# Patient Record
Sex: Female | Born: 1935
Health system: Southern US, Community
[De-identification: ages and names within clinical notes are randomized; demographics above are authoritative.]

## PROBLEM LIST (undated history)

## (undated) DIAGNOSIS — I272 Pulmonary hypertension, unspecified: Secondary | ICD-10-CM

## (undated) DIAGNOSIS — H3530 Unspecified macular degeneration: Secondary | ICD-10-CM

## (undated) DIAGNOSIS — I10 Essential (primary) hypertension: Secondary | ICD-10-CM

## (undated) DIAGNOSIS — I2510 Atherosclerotic heart disease of native coronary artery without angina pectoris: Secondary | ICD-10-CM

## (undated) DIAGNOSIS — F319 Bipolar disorder, unspecified: Secondary | ICD-10-CM

## (undated) DIAGNOSIS — K219 Gastro-esophageal reflux disease without esophagitis: Secondary | ICD-10-CM

## (undated) DIAGNOSIS — J449 Chronic obstructive pulmonary disease, unspecified: Secondary | ICD-10-CM

## (undated) HISTORY — PX: DILATION AND CURETTAGE OF UTERUS: SHX78

## (undated) HISTORY — PX: OTHER SURGICAL HISTORY: SHX169

## (undated) HISTORY — DX: Pulmonary hypertension, unspecified: I27.20

## (undated) HISTORY — DX: Bipolar disorder, unspecified: F31.9

## (undated) HISTORY — DX: Atherosclerotic heart disease of native coronary artery without angina pectoris: I25.10

## (undated) HISTORY — DX: Essential (primary) hypertension: I10

## (undated) HISTORY — PX: CATARACT EXTRACTION: SUR2

## (undated) HISTORY — DX: Chronic obstructive pulmonary disease, unspecified: J44.9

## (undated) HISTORY — DX: Gastro-esophageal reflux disease without esophagitis: K21.9

## (undated) HISTORY — DX: Unspecified macular degeneration: H35.30

---

## 2004-09-02 ENCOUNTER — Ambulatory Visit: Payer: Self-pay | Admitting: Internal Medicine

## 2004-10-20 ENCOUNTER — Ambulatory Visit: Payer: Self-pay | Admitting: Internal Medicine

## 2004-10-20 ENCOUNTER — Ambulatory Visit (HOSPITAL_COMMUNITY): Admission: RE | Admit: 2004-10-20 | Discharge: 2004-10-20 | Payer: Self-pay | Admitting: Internal Medicine

## 2005-10-11 ENCOUNTER — Ambulatory Visit: Payer: Self-pay | Admitting: Cardiology

## 2005-12-02 ENCOUNTER — Encounter: Payer: Self-pay | Admitting: Cardiology

## 2006-10-12 ENCOUNTER — Ambulatory Visit: Payer: Self-pay | Admitting: Cardiology

## 2007-01-12 ENCOUNTER — Ambulatory Visit: Payer: Self-pay | Admitting: Cardiology

## 2007-12-03 ENCOUNTER — Ambulatory Visit: Payer: Self-pay | Admitting: Cardiology

## 2007-12-05 ENCOUNTER — Encounter: Payer: Self-pay | Admitting: Cardiology

## 2008-10-09 ENCOUNTER — Encounter: Payer: Self-pay | Admitting: Cardiology

## 2008-10-22 ENCOUNTER — Encounter: Payer: Self-pay | Admitting: Cardiology

## 2009-01-14 ENCOUNTER — Encounter: Payer: Self-pay | Admitting: Cardiology

## 2009-02-10 ENCOUNTER — Ambulatory Visit (HOSPITAL_COMMUNITY): Admission: RE | Admit: 2009-02-10 | Discharge: 2009-02-10 | Payer: Self-pay | Admitting: Ophthalmology

## 2009-04-15 DIAGNOSIS — I2789 Other specified pulmonary heart diseases: Secondary | ICD-10-CM

## 2009-04-15 DIAGNOSIS — I10 Essential (primary) hypertension: Secondary | ICD-10-CM

## 2009-05-06 ENCOUNTER — Ambulatory Visit: Payer: Self-pay | Admitting: Cardiology

## 2009-05-06 DIAGNOSIS — I6529 Occlusion and stenosis of unspecified carotid artery: Secondary | ICD-10-CM

## 2009-05-06 DIAGNOSIS — R011 Cardiac murmur, unspecified: Secondary | ICD-10-CM

## 2009-05-06 DIAGNOSIS — J449 Chronic obstructive pulmonary disease, unspecified: Secondary | ICD-10-CM

## 2009-06-01 ENCOUNTER — Ambulatory Visit: Payer: Self-pay | Admitting: Cardiology

## 2009-06-01 ENCOUNTER — Encounter: Payer: Self-pay | Admitting: Cardiology

## 2009-10-05 ENCOUNTER — Encounter: Payer: Self-pay | Admitting: Cardiology

## 2009-12-08 ENCOUNTER — Encounter: Payer: Self-pay | Admitting: Cardiology

## 2009-12-09 ENCOUNTER — Ambulatory Visit: Payer: Self-pay | Admitting: Cardiology

## 2009-12-09 DIAGNOSIS — E785 Hyperlipidemia, unspecified: Secondary | ICD-10-CM | POA: Insufficient documentation

## 2010-08-17 NOTE — Miscellaneous (Signed)
  Clinical Lists Changes  Observations: Added new observation of PAST MED HX: PULMONARY HYPERTENSION (ICD-416.8) HYPERTENSION, UNSPECIFIED (ICD-401.9) Bipolar disorder with multiple medications. Carotid artery disease... Doppler.. may, 2009... minimal bilateral disease EF  greater than 65%...echo... November 15 02010.... hyperdynamic function... mild RV dilatation... mild MR... mild TR... 40-45 mm mercury COPD.Marland Kitchen CT chest... may, 2007.Marland KitchenMarland KitchenDr. Orson Aloe Granuloma.. right upper lobe... CT/... may, 2007 (12/08/2009 14:34) Added new observation of PRIMARY MD: Doreen Beam, MD (12/08/2009 14:34)       Past History:  Past Medical History: PULMONARY HYPERTENSION (ICD-416.8) HYPERTENSION, UNSPECIFIED (ICD-401.9) Bipolar disorder with multiple medications. Carotid artery disease... Doppler.. may, 2009... minimal bilateral disease EF  greater than 65%...echo... November 15 02010.... hyperdynamic function... mild RV dilatation... mild MR... mild TR... 40-45 mm mercury COPD.Marland Kitchen CT chest... may, 2007.Marland KitchenMarland KitchenDr. Orson Aloe Granuloma.. right upper lobe... CT/... may, 2007

## 2010-08-17 NOTE — Assessment & Plan Note (Signed)
Summary: PER PT CALL-JM   Visit Type:  Follow-up Primary Provider:  Doreen Beam, MD  CC:  hypertension.  History of Present Illness: The patient is seen for cardiology followup.  She is a delightful lady who is now 77.  She has brought me a long list of questions concerning multiple medical tests he think she should have.  I have stressed to her that she is healthy at age 77 and that she does not need these tests.  She had a screening study done by a company that gives you a cardiac risk based on your Omega 3 levels.  I explained to her that this is not formally recommended but that taking omega-3 fish oil would certainly be appropriate.  She's not having any chest pain or shortness of breath.  Preventive Screening-Counseling & Management  Alcohol-Tobacco     Smoking Status: never  Current Medications (verified): 1)  Pa Resveratrol 250 Mg Caps (Resveratrol) .... Take 1 Tablet By Mouth Once A Day 2)  Coq10 30 Mg Caps (Coenzyme Q10) .... Take 1 Tablet By Mouth Once A Day 3)  Multivitamins  Caps (Multiple Vitamin) .... Take 1 Tablet By Mouth Once A Day 4)  Vitamin D3 2000 Unit Caps (Cholecalciferol) .... Take 1 Tablet By Mouth Once A Day 5)  Divalproex Sodium 250 Mg Tbec (Divalproex Sodium) .... Take 1 Tablet By Mouth Two Times A Day' 6)  Trazodone Hcl 50 Mg Tabs (Trazodone Hcl) .... Take 1 Tab By Mouth At Bedtime 7)  Lisinopril 40 Mg Tabs (Lisinopril) .... Take 1 Tablet By Mouth Once A Day 8)  Risperdal 1 Mg Tabs (Risperidone) .... Take 1 Tab By Mouth At Bedtime 9)  Turmeric Curcumin  Caps (Misc Natural Products) .... Take 1 Tablet By Mouth Once A Day 10)  Advanced Care Cholesterol  Kit (Cholesterol) .... Take 1 Tablet By Mouth Once A Day 11)  Red Yeast Rice  Powd (Red Yeast Rice Extract) .... 500mg  Take 1 Tablet By Mouth Once A Day 12)  Strontium Chloride  Crys (Strontium Chloride) .... Take 1 Tablet By Mouth Once A Day 13)  Opti-Gold .... Take 1 Tablet By Mouth Once A Day 14)  Metoprolol  Succinate 50 Mg Xr24h-Tab (Metoprolol Succinate) .... Take 1 Tablet By Mouth Once A Day  Allergies (verified): 1)  ! Pcn 2)  ! Sulfa 3)  ! Doxycycline 4)  ! * Seroquel  Comments:  Nurse/Medical Assistant: The patient's medications and allergies were reviewed with the patient and were updated in the Medication and Allergy Lists. Bottles and verbally reviewed.  Past History:  Past Medical History: Last updated: 12/08/2009 PULMONARY HYPERTENSION (ICD-416.8) HYPERTENSION, UNSPECIFIED (ICD-401.9) Bipolar disorder with multiple medications. Carotid artery disease... Doppler.. may, 2009... minimal bilateral disease EF  greater than 65%...echo... November 15 02010.... hyperdynamic function... mild RV dilatation... mild MR... mild TR... 40-45 mm mercury COPD.Marland Kitchen CT chest... may, 2007.Marland KitchenMarland KitchenDr. Orson Aloe Granuloma.. right upper lobe... CT/... may, 2007  Review of Systems       Patient denies fever, chills, headache, sweats, rash, change in vision, change in hearing, chest pain, cough, nausea vomiting, urinary symptoms.  All other systems are reviewed and are negative.  Vital Signs:  Patient profile:   77 year old female Height:      65 inches Weight:      161 pounds BMI:     26.89 Pulse rate:   87 / minute BP sitting:   135 / 80  (left arm) Cuff size:   regular  Vitals  Entered By: Carlye Grippe (Dec 09, 2009 1:14 PM)  Physical Exam  General:  patient is stable in general. Eyes:  no xanthelasma. Neck:  no jugular venous distention. Lungs:  lungs are clear.  Respiratory effort is nonlabored. Heart:  cardiac exam reveals S1-S2.  No clicks or significant murmurs. Abdomen:  abdomen is soft. Extremities:  no peripheral edema. Psych:  patient is oriented to person time and place.  As outlined she has a multitude of questions.   Impression & Recommendations:  Problem # 1:  MURMUR (ICD-785.2) One I had seen the patient last we arranged for a 2-D echo.  Study was done November, 2010.   She has good left ventricular function.  There is mild mitral regurgitation.  I explained this to her..  She does not need any further followup.  Problem # 2:  CAROTID ARTERY DISEASE (ICD-433.10) We know her carotid disease is only mild.  She does not need testing at this time.  Problem # 3:  HYPERTENSION, UNSPECIFIED (ICD-401.9)  Her updated medication list for this problem includes:    Lisinopril 40 Mg Tabs (Lisinopril) .Marland Kitchen... Take 1 tablet by mouth once a day    Metoprolol Succinate 50 Mg Xr24h-tab (Metoprolol succinate) .Marland Kitchen... Take 1 tablet by mouth once a day Blood pressure is controlled.  No change in therapy.  Problem # 4:  DYSLIPIDEMIA (ICD-272.4) Recent labs showed her LDL is 150.  She has no proven coronary disease and she is 77 years of age.  Red yeast rice has been recommended to her and I certainly agree.  I am also in favor of omega 3 fish oil.  I feel she should not have any other tests done at this point.  Patient Instructions: 1)  Your physician wants you to follow-up in: 6 months. You will receive a reminder letter in the mail one-two months in advance. If you don't receive a letter, please call our office to schedule the follow-up appointment. 2)  Your physician recommends that you continue on your current medications as directed. Please refer to the Current Medication list given to you today.

## 2010-10-24 LAB — HEMOGLOBIN AND HEMATOCRIT, BLOOD
HCT: 34.7 % — ABNORMAL LOW (ref 36.0–46.0)
Hemoglobin: 12 g/dL (ref 12.0–15.0)

## 2010-10-24 LAB — BASIC METABOLIC PANEL
BUN: 15 mg/dL (ref 6–23)
CO2: 29 mEq/L (ref 19–32)
Chloride: 106 mEq/L (ref 96–112)
Creatinine, Ser: 0.85 mg/dL (ref 0.4–1.2)
GFR calc non Af Amer: >60 mL/min (ref >60)
Glucose, Bld: 90 mg/dL (ref 70–99)

## 2010-11-30 NOTE — Assessment & Plan Note (Signed)
[facility]                          [facility]   NAME:Campbell Campbell [place]G                     MRN:          [id number]  DATE:01/12/2007                            DOB:          May 18, 1918    Ms. Isabella Campbell is seen for followup.  She is a delightful elderly lady.  I saw  her last in March of 2008.  I have a note from Dr. Orson Aloe from 2007.  I have encouraged the patient to see Dr. Orson Aloe in followup and we  will help arrange this.  We know that she has good LV function.  We know  she has moderate pulmonary hypertension.  She does have shortness of  breath.  Dr. Orson Aloe also noted that she had mild obstructive lung  disease by PFTs.  He had started her on oxygen and there was question of  a trial of Foradil and it is time for followup.   She is doing well.  She does have some shortness of breath with  exertion.  She has not had chest pain or syncope.   PAST MEDICAL HISTORY:  ALLERGIES:  PENICILLIN, SULFA, DOXYCYCLINE.   MEDICATIONS:  Multiple home medications.  1. Benicar 40.  2. N-acetylcysteine.  3. Trazodone  4. Metoprolol.  5. Risperdal.  6. Alprazolam.  7. Depakote.   OTHER MEDICAL PROBLEMS:  See the list below.   REVIEW OF SYSTEMS:  Other than some exertional shortness of breath, she  is feeling well.   PHYSICAL EXAMINATION:  Blood pressure is 125/71 with a pulse of 64.  Weight is 164.  The patient is oriented to person, time, and place.  Her  affect is normal today.  She had multiple questions for me and we  answered all of them.  HEENT:  Reveals no xanthelasma, she has normal extraocular motion.  There are no carotid bruits.  There is no jugular venous distention.  CARDIAC:  Reveals an S1 with an S2.  There are no clicks or significant  murmurs.  LUNGS:  Clear.  Respiratory effort is not labored.  She has normal bowel sounds.  There is no significant peripheral edema.   PROBLEM:  1,  ALLERGIES TO PENICILLIN, SULFA,  AND DOXYCYCLINE.  1. Bipolar disorder with multiple medications.  2. Hypertension, treated.  3. Question of mild carotid disease by history.  4. [facility] screening showing ankle-brachial index of 0.8 in her right      leg.  5. Normal systolic function by echo in 2007.  6. Moderate pulmonary hypertension by echo in 2007.  7. Shortness of breath.  At this point it would appear that this is      primarily pulmonary.  We will be arranging followup with Dr.      Orson Aloe.     Luis Abed, MD, [facility], [place]  Electronically Signed    JDK/MedQ  DD: 01/12/2007  DT: 01/12/2007  Job #: [id number]   cc:   Cherie Ouch  Dhruv Vyas

## 2010-11-30 NOTE — Assessment & Plan Note (Signed)
[facility]                          [facility]   NAME:Isabella Campbell, Isabella Campbell                     MRN:          [id number]  DATE:12/03/2007                            DOB:          [birth year]    Isabella Campbell is back for follow-up.  She is actually doing very well.  She  is 77 years old.  She has brought me the multiple articles that she has  read that question whether she should be having other studies none of  which would be approved by her insurance company.  She wants to have a  hypercoagulable analysis to see if she has a hypercoagulable problem.  I  explained to her that she is 77 years of age and she has never had a  blood clot, and that this would not be appropriate at this time.  She  also wants some another study called a PLAC study, a plaque study.  At  this time will not do this.  We will proceed with a fasting lipid  profile.  The patient has good LV function.  She has some pulmonary  hypertension.  She has mild obstructive pulmonary disease.  She is on  oxygen at home.   ALLERGIES:  PENICILLIN, SULFA, DOXYCYCLINE.   MEDICATIONS:  1. Magnesium.  2. Alprazolam.  3. Depakote.  4. Calcium.  5. Multivitamin.  6. Coenzyme Q10.   OTHER MEDICAL PROBLEMS:  See the list below.   REVIEW OF SYSTEMS:  She does have some shortness of breath with  exertion.   Otherwise, Review of Systems is negative.   PHYSICAL EXAMINATION:  VITAL SIGNS:  Blood pressure is 119/63 with a  pulse of 70.  GENERAL:  The patient is oriented to person, time, and place.  Her  affect reveals that she is extremely inquisitive as outlined above.  Wants all types of testing.  LUNGS:  Clear.  Respiratory effort is not labored.  HEENT:  Reveals no xanthelasma.  She has normal extraocular motion.  NECK:  There are no carotid bruits.  There is no jugular venous  distention.  CARDIAC:  Reveals S1 with an S2.  There are no clicks or significant  murmurs.  ABDOMEN:   Soft.  EXTREMITIES:  She has no peripheral edema.   PROBLEMS:  1. Bipolar disorder with multiple medications.  2. Hypertension treated.  3. Question of mild carotid disease by history.  We need to arrange      for a follow-up carotid Doppler.  4. Normal systolic function by echo in 2007.  5. Moderate pulmonary hypertension.  I believe we should not be      pushing for other studies at this time.   Overall the patient is stable.  We will obtain fasting lipid and obtain  carotid Dopplers.     Luis Abed, MD, [facility]  Electronically Signed    JDK/MedQ  DD: 12/03/2007  DT: 12/03/2007  Job #: [id number]   cc:   Doreen Beam, MD  Cherie Ouch

## 2010-12-03 NOTE — Op Note (Signed)
NAME:  Kivett, Shulamit              ACCOUNT NO.:  [id number]   MEDICAL RECORD NO.:  [id number]          PATIENT TYPE:  AMB   LOCATION:  DAY                           FACILITY:  [facility]   PHYSICIAN:  Lionel December, M.D.    DATE OF BIRTH:  April 15, 1918   DATE OF PROCEDURE:  10/20/2004  DATE OF DISCHARGE:                                 OPERATIVE REPORT   PROCEDURE:  Esophagogastroduodenoscopy.   Isabella Campbell is an 77 year old Caucasian female who is had symptoms of GERD for  a year, who has not responded well to therapy.  She has failed Aciphex.  She  was seen in the office six weeks ago and given samples of two different  PPIs.  She is not sure if she had better results from Prevacid.  She is  concerned that she may have damaged her esophagus.  She took Fosamax for  three years.  Given late onset of her symptoms, the fact that she is not  responding to therapy, EGD was advised.  The patient is agreeable.  The  procedure risks were reviewed the patient,  informed consent was obtained.   PREMEDICATION:  Cetacaine spray for pharyngeal topical anesthesia, Demerol  425 mg IV, Versed 2 mg IV in divided dose.   FINDINGS:  Procedure performed in endoscopy suite.  The patient's vital  signs and O2 saturation were monitored during procedure and remained stable.  The patient was placed in the left lateral recumbent position and Olympus  video scope was passed via oropharynx without any difficulty into esophagus.  Mucosa of the esophagus was normal.  GE junction was located at 38 cm from  the incisors, and there was erythema right at the Z-line.  No hernia was  noted.   Stomach:  It was empty and distended very well with insufflation.  Folds of  the proximal stomach were normal.  Three prepyloric erosions were noted.  Pyloric channel was patent.  There was a small fundal polyp, which was  ablated via cold biopsy.  No ulcers or tumor mass.   Duodenum:  Bulbar mucosa was normal.  The scope was passed to  the second  part of the duodenum, where mucosa and folds were normal.  Endoscope was  withdrawn.  The patient tolerated the procedure well.   FINAL DIAGNOSES:  1.  Mild changes of reflux esophagitis limited the gastroesophageal      junction.  No evidence of Barrett's or hernia.  2.  Erosive antral gastritis.  3.  Fundal polyp.  It was ablated via cold biopsy.   RECOMMENDATIONS:  1.  She will continue entire reflux measures.  2.  She will stay on Prevacid 30 mg p.o. q.a.m. for at least four weeks      before any changes are made in the therapy.  3.  I will be contacting the patient before further changes of made.  4.  Next H. pylori serology will be checked today.  5.  I will be contacting the patient with results of biopsy and blood test.      NR/MEDQ  D:  10/20/2004  T:  10/20/2004  Job:  [id number]   cc:   Doreen Beam  [address]  [place]  [place] [address]  Fax: [phone]

## 2010-12-03 NOTE — Assessment & Plan Note (Signed)
[facility]                          [facility]   NAME:Isabella Campbell, Isabella Campbell                     MRN:          [id number]  DATE:10/12/2006                            DOB:          June 01, 1918    Ms. Isabella Campbell is seen for cardiology followup.  This delightful 77 year old  lady is stable.  She has had some sensation of shortness of breath in  the past.  I had seen her with a complete evaluation on October 11, 2005.  At that time, I felt that her cardiac status was stable.  She has normal  LV function.  I felt that she could be watched medically.  I recommended  that pulmonary evaluation could be considered.  The patient tells me  that she saw Dr. Orson Campbell.  I will obtain records from him.  There was  question of a partial heart catheterization (per the patient).  I  presumed that this may have been a right heart cath and there was  consideration of this being done by Dr. Earna Campbell in [place].  The  patient tells me that she had a follow up echo there.  She decided not  to have the heart cath.  She is actually quite stable.  She has multiple  questions for me about various other medications.  She is stable.  She  is not having chest pain.  There has been no syncope or presyncope.  She  is going about reasonable activities.   PAST MEDICAL HISTORY:   ALLERGIES:  PENICILLIN, SULFA, and DOXYCYCLINE.   MEDICATIONS:  Cod liver oil, tumeric, cider vinegar, green tea, Campbell  vera, blueberry leaf.  I made it clear to the patient that it is my  practice to not recommend herbal medicines.  However, final decision  will be up to her.   OTHER MEDICAL PROBLEMS:  See the list below.   REVIEW OF SYSTEMS:  She has no significant complaints.  She does have  some shortness of breath.  However, this is quite stable.  Otherwise her  review of systems is negative.   PHYSICAL EXAMINATION:  Her blood pressure today is 140/76 with a pulse  of 62.  Her weight is 167  pounds.  The patient is oriented to person, time and place.  Affect is normal.  She is very alert and very inquisitive.  HEENT:  Reveals no xanthelasma.  She has normal extraocular motion.  There are no carotid bruits.  There is no jugular venous distention.  CARDIAC:  Exam reveals an S1 with an S2.  There are no clicks or  significant murmurs.  LUNGS:  Are clear.  There is no respiratory distress.  ABDOMEN:  Is soft.  There are no masses or bruits.  She has no significant peripheral edema.  There are no major  musculoskeletal deformities.   The CT scan that was ordered by Dr. Orson Campbell in May 2007 revealed that  she had emphysematous changes.  She had a small granuloma in the right  upper lobe.  She had a heterogenous left thyroid lobe and a simple left  mid pole renal cyst.  EKG today shows no significant abnormality.   PROBLEMS:  1. Allergies to PENICILLIN, SULFA and DOXYCYCLINE.  2. Bipolar disorder on multiple medications.  3. Hypertension - treated.  4. Life line screening with a question of mild disease of her left      carotid.  5. Life line screening showing an ankle brachial index of 0.8 on her      right leg.  6. Echocardiogram showing normal systolic function.  7. Echocardiogram revealing moderate pulmonary hypertension.  8. Difficulty with breathing and panting and this is stable.   I believe that Ms. Isabella Campbell is stable.  I will ask for information from Dr.  Orson Campbell concerning her pulmonary workup.  I would recommend no further  cardiac workup at this time.     Isabella Abed, MD, [facility]  Electronically Signed    JDK/MedQ  DD: 10/12/2006  DT: 10/12/2006  Job #: [id number]   cc:   Dr. Orson Campbell

## 2012-02-10 ENCOUNTER — Encounter: Payer: Self-pay | Admitting: Pediatrics

## 2012-02-13 ENCOUNTER — Encounter: Payer: Self-pay | Admitting: Pediatrics

## 2015-08-06 DIAGNOSIS — E2839 Other primary ovarian failure: Secondary | ICD-10-CM | POA: Diagnosis not present

## 2015-09-02 DIAGNOSIS — I739 Peripheral vascular disease, unspecified: Secondary | ICD-10-CM | POA: Diagnosis not present

## 2015-09-02 DIAGNOSIS — B351 Tinea unguium: Secondary | ICD-10-CM | POA: Diagnosis not present

## 2015-09-02 DIAGNOSIS — L110 Acquired keratosis follicularis: Secondary | ICD-10-CM | POA: Diagnosis not present

## 2015-09-14 DIAGNOSIS — F3181 Bipolar II disorder: Secondary | ICD-10-CM | POA: Diagnosis not present

## 2015-09-22 DIAGNOSIS — F319 Bipolar disorder, unspecified: Secondary | ICD-10-CM | POA: Diagnosis not present

## 2015-09-22 DIAGNOSIS — R6889 Other general symptoms and signs: Secondary | ICD-10-CM | POA: Diagnosis not present

## 2015-09-22 DIAGNOSIS — J111 Influenza due to unidentified influenza virus with other respiratory manifestations: Secondary | ICD-10-CM | POA: Diagnosis not present

## 2015-09-22 DIAGNOSIS — I10 Essential (primary) hypertension: Secondary | ICD-10-CM | POA: Diagnosis not present

## 2015-11-11 DIAGNOSIS — B351 Tinea unguium: Secondary | ICD-10-CM | POA: Diagnosis not present

## 2015-11-11 DIAGNOSIS — I739 Peripheral vascular disease, unspecified: Secondary | ICD-10-CM | POA: Diagnosis not present

## 2015-11-11 DIAGNOSIS — L110 Acquired keratosis follicularis: Secondary | ICD-10-CM | POA: Diagnosis not present

## 2015-11-27 DIAGNOSIS — Z880 Allergy status to penicillin: Secondary | ICD-10-CM | POA: Diagnosis not present

## 2015-11-27 DIAGNOSIS — Z79899 Other long term (current) drug therapy: Secondary | ICD-10-CM | POA: Diagnosis not present

## 2015-11-27 DIAGNOSIS — I272 Other secondary pulmonary hypertension: Secondary | ICD-10-CM | POA: Diagnosis not present

## 2015-11-27 DIAGNOSIS — Z882 Allergy status to sulfonamides status: Secondary | ICD-10-CM | POA: Diagnosis not present

## 2015-11-27 DIAGNOSIS — Z888 Allergy status to other drugs, medicaments and biological substances status: Secondary | ICD-10-CM | POA: Diagnosis not present

## 2015-11-27 DIAGNOSIS — Z881 Allergy status to other antibiotic agents status: Secondary | ICD-10-CM | POA: Diagnosis not present

## 2015-11-27 DIAGNOSIS — F419 Anxiety disorder, unspecified: Secondary | ICD-10-CM | POA: Diagnosis not present

## 2015-11-27 DIAGNOSIS — R079 Chest pain, unspecified: Secondary | ICD-10-CM | POA: Diagnosis not present

## 2015-11-28 DIAGNOSIS — I272 Other secondary pulmonary hypertension: Secondary | ICD-10-CM | POA: Diagnosis not present

## 2015-11-28 DIAGNOSIS — R079 Chest pain, unspecified: Secondary | ICD-10-CM | POA: Diagnosis not present

## 2015-12-02 DIAGNOSIS — L89893 Pressure ulcer of other site, stage 3: Secondary | ICD-10-CM | POA: Diagnosis not present

## 2015-12-02 DIAGNOSIS — I739 Peripheral vascular disease, unspecified: Secondary | ICD-10-CM | POA: Diagnosis not present

## 2015-12-02 DIAGNOSIS — B351 Tinea unguium: Secondary | ICD-10-CM | POA: Diagnosis not present

## 2015-12-09 DIAGNOSIS — Z299 Encounter for prophylactic measures, unspecified: Secondary | ICD-10-CM | POA: Diagnosis not present

## 2015-12-09 DIAGNOSIS — I2510 Atherosclerotic heart disease of native coronary artery without angina pectoris: Secondary | ICD-10-CM | POA: Diagnosis not present

## 2015-12-09 DIAGNOSIS — I272 Other secondary pulmonary hypertension: Secondary | ICD-10-CM | POA: Diagnosis not present

## 2015-12-09 DIAGNOSIS — Z09 Encounter for follow-up examination after completed treatment for conditions other than malignant neoplasm: Secondary | ICD-10-CM | POA: Diagnosis not present

## 2015-12-09 DIAGNOSIS — R079 Chest pain, unspecified: Secondary | ICD-10-CM | POA: Diagnosis not present

## 2015-12-30 DIAGNOSIS — I739 Peripheral vascular disease, unspecified: Secondary | ICD-10-CM | POA: Diagnosis not present

## 2015-12-30 DIAGNOSIS — L89892 Pressure ulcer of other site, stage 2: Secondary | ICD-10-CM | POA: Diagnosis not present

## 2016-01-08 DIAGNOSIS — F3181 Bipolar II disorder: Secondary | ICD-10-CM | POA: Diagnosis not present

## 2016-01-11 DIAGNOSIS — I10 Essential (primary) hypertension: Secondary | ICD-10-CM | POA: Diagnosis not present

## 2016-01-11 DIAGNOSIS — I272 Other secondary pulmonary hypertension: Secondary | ICD-10-CM | POA: Diagnosis not present

## 2016-01-27 DIAGNOSIS — B351 Tinea unguium: Secondary | ICD-10-CM | POA: Diagnosis not present

## 2016-01-27 DIAGNOSIS — L110 Acquired keratosis follicularis: Secondary | ICD-10-CM | POA: Diagnosis not present

## 2016-01-27 DIAGNOSIS — I739 Peripheral vascular disease, unspecified: Secondary | ICD-10-CM | POA: Diagnosis not present

## 2016-02-15 DIAGNOSIS — M7989 Other specified soft tissue disorders: Secondary | ICD-10-CM | POA: Diagnosis not present

## 2016-02-15 DIAGNOSIS — I10 Essential (primary) hypertension: Secondary | ICD-10-CM | POA: Diagnosis not present

## 2016-02-15 DIAGNOSIS — F319 Bipolar disorder, unspecified: Secondary | ICD-10-CM | POA: Diagnosis not present

## 2016-02-15 DIAGNOSIS — M1990 Unspecified osteoarthritis, unspecified site: Secondary | ICD-10-CM | POA: Diagnosis not present

## 2016-02-15 DIAGNOSIS — R350 Frequency of micturition: Secondary | ICD-10-CM | POA: Diagnosis not present

## 2016-02-17 DIAGNOSIS — L89892 Pressure ulcer of other site, stage 2: Secondary | ICD-10-CM | POA: Diagnosis not present

## 2016-02-17 DIAGNOSIS — B351 Tinea unguium: Secondary | ICD-10-CM | POA: Diagnosis not present

## 2016-02-17 DIAGNOSIS — I739 Peripheral vascular disease, unspecified: Secondary | ICD-10-CM | POA: Diagnosis not present

## 2016-02-20 DIAGNOSIS — G4489 Other headache syndrome: Secondary | ICD-10-CM | POA: Diagnosis not present

## 2016-02-20 DIAGNOSIS — Z79899 Other long term (current) drug therapy: Secondary | ICD-10-CM | POA: Diagnosis not present

## 2016-02-20 DIAGNOSIS — E7800 Pure hypercholesterolemia, unspecified: Secondary | ICD-10-CM | POA: Diagnosis not present

## 2016-02-20 DIAGNOSIS — R030 Elevated blood-pressure reading, without diagnosis of hypertension: Secondary | ICD-10-CM | POA: Diagnosis not present

## 2016-02-20 DIAGNOSIS — I10 Essential (primary) hypertension: Secondary | ICD-10-CM | POA: Diagnosis not present

## 2016-02-20 DIAGNOSIS — F339 Major depressive disorder, recurrent, unspecified: Secondary | ICD-10-CM | POA: Diagnosis not present

## 2016-02-20 DIAGNOSIS — R51 Headache: Secondary | ICD-10-CM | POA: Diagnosis not present

## 2016-02-26 DIAGNOSIS — F319 Bipolar disorder, unspecified: Secondary | ICD-10-CM | POA: Diagnosis not present

## 2016-02-26 DIAGNOSIS — Z09 Encounter for follow-up examination after completed treatment for conditions other than malignant neoplasm: Secondary | ICD-10-CM | POA: Diagnosis not present

## 2016-02-26 DIAGNOSIS — Z299 Encounter for prophylactic measures, unspecified: Secondary | ICD-10-CM | POA: Diagnosis not present

## 2016-02-26 DIAGNOSIS — I272 Other secondary pulmonary hypertension: Secondary | ICD-10-CM | POA: Diagnosis not present

## 2016-02-29 DIAGNOSIS — I350 Nonrheumatic aortic (valve) stenosis: Secondary | ICD-10-CM | POA: Diagnosis not present

## 2016-02-29 DIAGNOSIS — I270 Primary pulmonary hypertension: Secondary | ICD-10-CM | POA: Diagnosis not present

## 2016-02-29 DIAGNOSIS — I340 Nonrheumatic mitral (valve) insufficiency: Secondary | ICD-10-CM | POA: Diagnosis not present

## 2016-03-15 DIAGNOSIS — Z299 Encounter for prophylactic measures, unspecified: Secondary | ICD-10-CM | POA: Diagnosis not present

## 2016-03-15 DIAGNOSIS — H6121 Impacted cerumen, right ear: Secondary | ICD-10-CM | POA: Diagnosis not present

## 2016-04-14 DIAGNOSIS — Z789 Other specified health status: Secondary | ICD-10-CM | POA: Diagnosis not present

## 2016-04-14 DIAGNOSIS — M1990 Unspecified osteoarthritis, unspecified site: Secondary | ICD-10-CM | POA: Diagnosis not present

## 2016-04-14 DIAGNOSIS — Z299 Encounter for prophylactic measures, unspecified: Secondary | ICD-10-CM | POA: Diagnosis not present

## 2016-04-14 DIAGNOSIS — I270 Primary pulmonary hypertension: Secondary | ICD-10-CM | POA: Diagnosis not present

## 2016-04-14 DIAGNOSIS — F319 Bipolar disorder, unspecified: Secondary | ICD-10-CM | POA: Diagnosis not present

## 2016-04-14 DIAGNOSIS — R413 Other amnesia: Secondary | ICD-10-CM | POA: Diagnosis not present

## 2016-04-20 DIAGNOSIS — B351 Tinea unguium: Secondary | ICD-10-CM | POA: Diagnosis not present

## 2016-04-20 DIAGNOSIS — L110 Acquired keratosis follicularis: Secondary | ICD-10-CM | POA: Diagnosis not present

## 2016-04-20 DIAGNOSIS — I739 Peripheral vascular disease, unspecified: Secondary | ICD-10-CM | POA: Diagnosis not present

## 2016-05-04 DIAGNOSIS — Z23 Encounter for immunization: Secondary | ICD-10-CM | POA: Diagnosis not present

## 2016-05-11 DIAGNOSIS — I739 Peripheral vascular disease, unspecified: Secondary | ICD-10-CM | POA: Diagnosis not present

## 2016-05-11 DIAGNOSIS — M79672 Pain in left foot: Secondary | ICD-10-CM | POA: Diagnosis not present

## 2016-05-11 DIAGNOSIS — L89893 Pressure ulcer of other site, stage 3: Secondary | ICD-10-CM | POA: Diagnosis not present

## 2016-05-16 DIAGNOSIS — L97529 Non-pressure chronic ulcer of other part of left foot with unspecified severity: Secondary | ICD-10-CM | POA: Diagnosis not present

## 2016-05-16 DIAGNOSIS — L89893 Pressure ulcer of other site, stage 3: Secondary | ICD-10-CM | POA: Diagnosis not present

## 2016-05-19 DIAGNOSIS — B351 Tinea unguium: Secondary | ICD-10-CM | POA: Diagnosis not present

## 2016-05-19 DIAGNOSIS — L110 Acquired keratosis follicularis: Secondary | ICD-10-CM | POA: Diagnosis not present

## 2016-05-19 DIAGNOSIS — I739 Peripheral vascular disease, unspecified: Secondary | ICD-10-CM | POA: Diagnosis not present

## 2016-05-25 DIAGNOSIS — E7800 Pure hypercholesterolemia, unspecified: Secondary | ICD-10-CM | POA: Diagnosis not present

## 2016-05-25 DIAGNOSIS — I10 Essential (primary) hypertension: Secondary | ICD-10-CM | POA: Diagnosis not present

## 2016-06-02 DIAGNOSIS — I739 Peripheral vascular disease, unspecified: Secondary | ICD-10-CM | POA: Diagnosis not present

## 2016-06-02 DIAGNOSIS — L89893 Pressure ulcer of other site, stage 3: Secondary | ICD-10-CM | POA: Diagnosis not present

## 2016-06-13 DIAGNOSIS — R32 Unspecified urinary incontinence: Secondary | ICD-10-CM | POA: Diagnosis not present

## 2016-06-13 DIAGNOSIS — J069 Acute upper respiratory infection, unspecified: Secondary | ICD-10-CM | POA: Diagnosis not present

## 2016-06-13 DIAGNOSIS — M1710 Unilateral primary osteoarthritis, unspecified knee: Secondary | ICD-10-CM | POA: Diagnosis not present

## 2016-06-23 DIAGNOSIS — F3181 Bipolar II disorder: Secondary | ICD-10-CM | POA: Diagnosis not present

## 2016-06-28 DIAGNOSIS — H2512 Age-related nuclear cataract, left eye: Secondary | ICD-10-CM | POA: Diagnosis not present

## 2016-06-28 DIAGNOSIS — H353131 Nonexudative age-related macular degeneration, bilateral, early dry stage: Secondary | ICD-10-CM | POA: Diagnosis not present

## 2016-06-28 DIAGNOSIS — Z961 Presence of intraocular lens: Secondary | ICD-10-CM | POA: Diagnosis not present

## 2016-06-28 DIAGNOSIS — H25012 Cortical age-related cataract, left eye: Secondary | ICD-10-CM | POA: Diagnosis not present

## 2016-07-08 DIAGNOSIS — E7800 Pure hypercholesterolemia, unspecified: Secondary | ICD-10-CM | POA: Diagnosis not present

## 2016-07-08 DIAGNOSIS — Z299 Encounter for prophylactic measures, unspecified: Secondary | ICD-10-CM | POA: Diagnosis not present

## 2016-07-08 DIAGNOSIS — Z1389 Encounter for screening for other disorder: Secondary | ICD-10-CM | POA: Diagnosis not present

## 2016-07-08 DIAGNOSIS — Z79899 Other long term (current) drug therapy: Secondary | ICD-10-CM | POA: Diagnosis not present

## 2016-07-08 DIAGNOSIS — Z1211 Encounter for screening for malignant neoplasm of colon: Secondary | ICD-10-CM | POA: Diagnosis not present

## 2016-07-08 DIAGNOSIS — R5383 Other fatigue: Secondary | ICD-10-CM | POA: Diagnosis not present

## 2016-07-08 DIAGNOSIS — Z0000 Encounter for general adult medical examination without abnormal findings: Secondary | ICD-10-CM | POA: Diagnosis not present

## 2016-07-08 DIAGNOSIS — Z7189 Other specified counseling: Secondary | ICD-10-CM | POA: Diagnosis not present

## 2016-07-21 DIAGNOSIS — I739 Peripheral vascular disease, unspecified: Secondary | ICD-10-CM | POA: Diagnosis not present

## 2016-07-21 DIAGNOSIS — L89893 Pressure ulcer of other site, stage 3: Secondary | ICD-10-CM | POA: Diagnosis not present

## 2016-07-28 ENCOUNTER — Encounter (INDEPENDENT_AMBULATORY_CARE_PROVIDER_SITE_OTHER): Payer: Self-pay

## 2016-07-28 ENCOUNTER — Encounter (INDEPENDENT_AMBULATORY_CARE_PROVIDER_SITE_OTHER): Payer: Self-pay | Admitting: Internal Medicine

## 2016-08-01 ENCOUNTER — Ambulatory Visit (INDEPENDENT_AMBULATORY_CARE_PROVIDER_SITE_OTHER): Payer: Self-pay | Admitting: Internal Medicine

## 2016-08-11 DIAGNOSIS — I739 Peripheral vascular disease, unspecified: Secondary | ICD-10-CM | POA: Diagnosis not present

## 2016-08-11 DIAGNOSIS — B351 Tinea unguium: Secondary | ICD-10-CM | POA: Diagnosis not present

## 2016-08-11 DIAGNOSIS — L89893 Pressure ulcer of other site, stage 3: Secondary | ICD-10-CM | POA: Diagnosis not present

## 2016-08-17 ENCOUNTER — Telehealth (INDEPENDENT_AMBULATORY_CARE_PROVIDER_SITE_OTHER): Payer: Self-pay | Admitting: *Deleted

## 2016-08-17 ENCOUNTER — Encounter (INDEPENDENT_AMBULATORY_CARE_PROVIDER_SITE_OTHER): Payer: Self-pay | Admitting: Internal Medicine

## 2016-08-17 ENCOUNTER — Ambulatory Visit (INDEPENDENT_AMBULATORY_CARE_PROVIDER_SITE_OTHER): Payer: Medicare Other | Admitting: Internal Medicine

## 2016-08-17 ENCOUNTER — Other Ambulatory Visit (INDEPENDENT_AMBULATORY_CARE_PROVIDER_SITE_OTHER): Payer: Self-pay | Admitting: Internal Medicine

## 2016-08-17 ENCOUNTER — Encounter (INDEPENDENT_AMBULATORY_CARE_PROVIDER_SITE_OTHER): Payer: Self-pay | Admitting: *Deleted

## 2016-08-17 VITALS — BP 144/72 | HR 76 | Temp 98.0°F | Ht 65.0 in | Wt 172.6 lb

## 2016-08-17 DIAGNOSIS — R195 Other fecal abnormalities: Secondary | ICD-10-CM

## 2016-08-17 NOTE — Progress Notes (Signed)
   Subjective:    Patient ID: Isabella Campbell, female    DOB: [birth year], 83 y.o.   MRN: [id number]  HPI  Referred by Dr. Sherril CroonVyas for positive stool card. Her last colonoscopy was in ?2012. And she says it was normal. She did have a hemorrhoid. She denies seeing any blood There has been no weight loss. Her appetite is good. She has a BM daily. No melena or BRRB. She drinks lots of fluid a day and takes senna daily. She also drinks prune juice every monring.  [facility] [place].  Patient would like to proceed with a colonoscopy.  07/08/2016 H and H 12.0 and 36.4  EGD 10/20/2004:   FINAL DIAGNOSES: Symptoms of GERD.  1.  Mild changes of reflux esophagitis limited the gastroesophageal      junction.  No evidence of Barrett's or hernia.  2.  Erosive antral gastritis.  3.  Fundal polyp.  It was ablated via cold biopsy.   Review of Systems Past Medical History:  Diagnosis Date  . Bipolar 1 disorder (HCC)   . COPD (chronic obstructive pulmonary disease) (HCC)    CT chest 11/2005 Dr. Orson AloeHenderson  . Coronary artery disease    minimal bilateral disease  . Hypertension   . Pulmonary hypertension     Past Surgical History:  Procedure Laterality Date  . DILATION AND CURETTAGE OF UTERUS    . hemrrhoidectomy      Allergies  Allergen Reactions  . Doxycycline     REACTION: hives  . Penicillins     REACTION: hives  . Quetiapine     REACTION: involuntary licking of tongue against teeth, felt really bad on med  . Sulfonamide Derivatives     REACTION: nauseated    Current Outpatient Prescriptions on File Prior to Visit  Medication Sig Dispense Refill  . divalproex (DEPAKOTE) 250 MG DR tablet Take 250 mg by mouth 2 (two) times daily.    . risperiDONE (RISPERDAL) 0.25 MG tablet Take 0.25 mg by mouth daily.    . traZODone (DESYREL) 50 MG tablet Take 50 mg by mouth at bedtime.    . TURMERIC PO Take by mouth.     No current facility-administered medications on file prior to  visit.        Objective:   Physical Exam Blood pressure (!) 144/72, pulse 76, temperature 98 F (36.7 C), height 5\' 5"  (1.651 m), weight 172 lb 9.6 oz (78.3 kg).  Alert and oriented. Skin warm and dry. Oral mucosa is moist.   . Sclera anicteric, conjunctivae is pink. Thyroid not enlarged. No cervical lymphadenopathy. Lungs clear. Heart regular rate and rhythm. Murmur heard  Abdomen is soft. Bowel sounds are positive. No hepatomegaly. No abdominal masses felt. No tenderness.  No edema to lower extremities.  Stool brown and guaiac negative.        Assessment & Plan:  Guaiac positive stool. Colonic neoplasm, AVM, polyp needs to be ruled out.  Colonoscopy. ( I discussed with Dr Karilyn Cotaehman) Patient would like to proceed with a colonoscopy.

## 2016-08-17 NOTE — Patient Instructions (Signed)
Colonoscopy.  The risks and benefits such as perforation, bleeding, and infection were reviewed with the patient and is agreeable. 

## 2016-08-17 NOTE — Telephone Encounter (Signed)
Patient needs trilyte 

## 2016-08-22 MED ORDER — PEG 3350-KCL-NA BICARB-NACL 420 G PO SOLR
4000.0000 mL | Freq: Once | ORAL | 0 refills | Status: AC
Start: 2016-08-22 — End: 2016-08-22

## 2016-09-01 ENCOUNTER — Telehealth (INDEPENDENT_AMBULATORY_CARE_PROVIDER_SITE_OTHER): Payer: Self-pay | Admitting: *Deleted

## 2016-09-01 NOTE — Telephone Encounter (Signed)
No answer at home. Will all Monday.

## 2016-09-01 NOTE — Telephone Encounter (Addendum)
Patient has questions about other tests she can do besides the colonoscopy -- she has canceled the TCS -- please call  [phone]

## 2016-09-05 NOTE — Telephone Encounter (Signed)
No answer at home #

## 2016-09-07 DIAGNOSIS — L89893 Pressure ulcer of other site, stage 3: Secondary | ICD-10-CM | POA: Diagnosis not present

## 2016-09-07 DIAGNOSIS — I739 Peripheral vascular disease, unspecified: Secondary | ICD-10-CM | POA: Diagnosis not present

## 2016-09-09 ENCOUNTER — Encounter (HOSPITAL_COMMUNITY): Payer: Self-pay

## 2016-09-09 ENCOUNTER — Ambulatory Visit (HOSPITAL_COMMUNITY): Admit: 2016-09-09 | Payer: Medicare Other | Admitting: Internal Medicine

## 2016-09-09 SURGERY — COLONOSCOPY
Anesthesia: Moderate Sedation

## 2016-09-14 ENCOUNTER — Encounter (INDEPENDENT_AMBULATORY_CARE_PROVIDER_SITE_OTHER): Payer: Self-pay

## 2016-09-28 DIAGNOSIS — L89893 Pressure ulcer of other site, stage 3: Secondary | ICD-10-CM | POA: Diagnosis not present

## 2016-09-28 DIAGNOSIS — I739 Peripheral vascular disease, unspecified: Secondary | ICD-10-CM | POA: Diagnosis not present

## 2016-10-06 DIAGNOSIS — I071 Rheumatic tricuspid insufficiency: Secondary | ICD-10-CM | POA: Diagnosis not present

## 2016-10-06 DIAGNOSIS — I2510 Atherosclerotic heart disease of native coronary artery without angina pectoris: Secondary | ICD-10-CM | POA: Diagnosis not present

## 2016-10-06 DIAGNOSIS — I272 Pulmonary hypertension, unspecified: Secondary | ICD-10-CM | POA: Diagnosis not present

## 2016-10-06 DIAGNOSIS — I10 Essential (primary) hypertension: Secondary | ICD-10-CM | POA: Diagnosis not present

## 2016-10-06 DIAGNOSIS — F319 Bipolar disorder, unspecified: Secondary | ICD-10-CM | POA: Diagnosis not present

## 2016-10-06 DIAGNOSIS — I350 Nonrheumatic aortic (valve) stenosis: Secondary | ICD-10-CM | POA: Diagnosis not present

## 2016-10-06 DIAGNOSIS — Z713 Dietary counseling and surveillance: Secondary | ICD-10-CM | POA: Diagnosis not present

## 2016-10-06 DIAGNOSIS — E7800 Pure hypercholesterolemia, unspecified: Secondary | ICD-10-CM | POA: Diagnosis not present

## 2016-10-06 DIAGNOSIS — Z299 Encounter for prophylactic measures, unspecified: Secondary | ICD-10-CM | POA: Diagnosis not present

## 2016-10-06 DIAGNOSIS — I6529 Occlusion and stenosis of unspecified carotid artery: Secondary | ICD-10-CM | POA: Diagnosis not present

## 2016-10-26 DIAGNOSIS — I739 Peripheral vascular disease, unspecified: Secondary | ICD-10-CM | POA: Diagnosis not present

## 2016-10-26 DIAGNOSIS — L89893 Pressure ulcer of other site, stage 3: Secondary | ICD-10-CM | POA: Diagnosis not present

## 2016-11-17 DIAGNOSIS — L89892 Pressure ulcer of other site, stage 2: Secondary | ICD-10-CM | POA: Diagnosis not present

## 2016-11-17 DIAGNOSIS — I739 Peripheral vascular disease, unspecified: Secondary | ICD-10-CM | POA: Diagnosis not present

## 2016-12-01 DIAGNOSIS — I10 Essential (primary) hypertension: Secondary | ICD-10-CM | POA: Diagnosis not present

## 2016-12-01 DIAGNOSIS — E7800 Pure hypercholesterolemia, unspecified: Secondary | ICD-10-CM | POA: Diagnosis not present

## 2016-12-05 DIAGNOSIS — F3181 Bipolar II disorder: Secondary | ICD-10-CM | POA: Diagnosis not present

## 2016-12-14 DIAGNOSIS — I10 Essential (primary) hypertension: Secondary | ICD-10-CM | POA: Diagnosis not present

## 2016-12-14 DIAGNOSIS — M810 Age-related osteoporosis without current pathological fracture: Secondary | ICD-10-CM | POA: Diagnosis not present

## 2016-12-14 DIAGNOSIS — K219 Gastro-esophageal reflux disease without esophagitis: Secondary | ICD-10-CM | POA: Diagnosis not present

## 2016-12-14 DIAGNOSIS — E7800 Pure hypercholesterolemia, unspecified: Secondary | ICD-10-CM | POA: Diagnosis not present

## 2016-12-14 DIAGNOSIS — Z6832 Body mass index (BMI) 32.0-32.9, adult: Secondary | ICD-10-CM | POA: Diagnosis not present

## 2016-12-14 DIAGNOSIS — I6529 Occlusion and stenosis of unspecified carotid artery: Secondary | ICD-10-CM | POA: Diagnosis not present

## 2016-12-14 DIAGNOSIS — R42 Dizziness and giddiness: Secondary | ICD-10-CM | POA: Diagnosis not present

## 2016-12-14 DIAGNOSIS — I2510 Atherosclerotic heart disease of native coronary artery without angina pectoris: Secondary | ICD-10-CM | POA: Diagnosis not present

## 2016-12-14 DIAGNOSIS — I350 Nonrheumatic aortic (valve) stenosis: Secondary | ICD-10-CM | POA: Diagnosis not present

## 2016-12-14 DIAGNOSIS — F319 Bipolar disorder, unspecified: Secondary | ICD-10-CM | POA: Diagnosis not present

## 2016-12-14 DIAGNOSIS — Z299 Encounter for prophylactic measures, unspecified: Secondary | ICD-10-CM | POA: Diagnosis not present

## 2016-12-14 DIAGNOSIS — Z79899 Other long term (current) drug therapy: Secondary | ICD-10-CM | POA: Diagnosis not present

## 2016-12-21 DIAGNOSIS — Z299 Encounter for prophylactic measures, unspecified: Secondary | ICD-10-CM | POA: Diagnosis not present

## 2016-12-21 DIAGNOSIS — Z6832 Body mass index (BMI) 32.0-32.9, adult: Secondary | ICD-10-CM | POA: Diagnosis not present

## 2016-12-21 DIAGNOSIS — H6123 Impacted cerumen, bilateral: Secondary | ICD-10-CM | POA: Diagnosis not present

## 2016-12-21 DIAGNOSIS — F319 Bipolar disorder, unspecified: Secondary | ICD-10-CM | POA: Diagnosis not present

## 2017-01-02 DIAGNOSIS — I340 Nonrheumatic mitral (valve) insufficiency: Secondary | ICD-10-CM | POA: Diagnosis not present

## 2017-01-02 DIAGNOSIS — I360 Nonrheumatic tricuspid (valve) stenosis: Secondary | ICD-10-CM | POA: Diagnosis not present

## 2017-01-02 DIAGNOSIS — I350 Nonrheumatic aortic (valve) stenosis: Secondary | ICD-10-CM | POA: Diagnosis not present

## 2017-01-02 DIAGNOSIS — Z049 Encounter for examination and observation for unspecified reason: Secondary | ICD-10-CM | POA: Diagnosis not present

## 2017-01-02 DIAGNOSIS — Z79899 Other long term (current) drug therapy: Secondary | ICD-10-CM | POA: Diagnosis not present

## 2017-01-02 DIAGNOSIS — I6523 Occlusion and stenosis of bilateral carotid arteries: Secondary | ICD-10-CM | POA: Diagnosis not present

## 2017-01-02 DIAGNOSIS — E7800 Pure hypercholesterolemia, unspecified: Secondary | ICD-10-CM | POA: Diagnosis not present

## 2017-01-02 DIAGNOSIS — R42 Dizziness and giddiness: Secondary | ICD-10-CM | POA: Diagnosis not present

## 2017-02-02 DIAGNOSIS — I739 Peripheral vascular disease, unspecified: Secondary | ICD-10-CM | POA: Diagnosis not present

## 2017-02-02 DIAGNOSIS — L89893 Pressure ulcer of other site, stage 3: Secondary | ICD-10-CM | POA: Diagnosis not present

## 2017-02-23 DIAGNOSIS — I739 Peripheral vascular disease, unspecified: Secondary | ICD-10-CM | POA: Diagnosis not present

## 2017-02-23 DIAGNOSIS — L89892 Pressure ulcer of other site, stage 2: Secondary | ICD-10-CM | POA: Diagnosis not present

## 2017-03-22 DIAGNOSIS — M1710 Unilateral primary osteoarthritis, unspecified knee: Secondary | ICD-10-CM | POA: Diagnosis not present

## 2017-03-22 DIAGNOSIS — E7800 Pure hypercholesterolemia, unspecified: Secondary | ICD-10-CM | POA: Diagnosis not present

## 2017-03-22 DIAGNOSIS — Z299 Encounter for prophylactic measures, unspecified: Secondary | ICD-10-CM | POA: Diagnosis not present

## 2017-03-22 DIAGNOSIS — F319 Bipolar disorder, unspecified: Secondary | ICD-10-CM | POA: Diagnosis not present

## 2017-03-22 DIAGNOSIS — I6529 Occlusion and stenosis of unspecified carotid artery: Secondary | ICD-10-CM | POA: Diagnosis not present

## 2017-03-22 DIAGNOSIS — I2510 Atherosclerotic heart disease of native coronary artery without angina pectoris: Secondary | ICD-10-CM | POA: Diagnosis not present

## 2017-03-22 DIAGNOSIS — Z713 Dietary counseling and surveillance: Secondary | ICD-10-CM | POA: Diagnosis not present

## 2017-03-22 DIAGNOSIS — Z6832 Body mass index (BMI) 32.0-32.9, adult: Secondary | ICD-10-CM | POA: Diagnosis not present

## 2017-03-22 DIAGNOSIS — I10 Essential (primary) hypertension: Secondary | ICD-10-CM | POA: Diagnosis not present

## 2017-04-06 DIAGNOSIS — L89893 Pressure ulcer of other site, stage 3: Secondary | ICD-10-CM | POA: Diagnosis not present

## 2017-04-06 DIAGNOSIS — I739 Peripheral vascular disease, unspecified: Secondary | ICD-10-CM | POA: Diagnosis not present

## 2017-04-20 DIAGNOSIS — L89893 Pressure ulcer of other site, stage 3: Secondary | ICD-10-CM | POA: Diagnosis not present

## 2017-04-20 DIAGNOSIS — I739 Peripheral vascular disease, unspecified: Secondary | ICD-10-CM | POA: Diagnosis not present

## 2017-05-03 DIAGNOSIS — Z23 Encounter for immunization: Secondary | ICD-10-CM | POA: Diagnosis not present

## 2017-05-11 DIAGNOSIS — L89893 Pressure ulcer of other site, stage 3: Secondary | ICD-10-CM | POA: Diagnosis not present

## 2017-05-11 DIAGNOSIS — I739 Peripheral vascular disease, unspecified: Secondary | ICD-10-CM | POA: Diagnosis not present

## 2017-06-15 DIAGNOSIS — M79671 Pain in right foot: Secondary | ICD-10-CM | POA: Diagnosis not present

## 2017-06-15 DIAGNOSIS — I739 Peripheral vascular disease, unspecified: Secondary | ICD-10-CM | POA: Diagnosis not present

## 2017-06-15 DIAGNOSIS — L89892 Pressure ulcer of other site, stage 2: Secondary | ICD-10-CM | POA: Diagnosis not present

## 2017-06-15 DIAGNOSIS — B351 Tinea unguium: Secondary | ICD-10-CM | POA: Diagnosis not present

## 2017-07-04 DIAGNOSIS — Z961 Presence of intraocular lens: Secondary | ICD-10-CM | POA: Diagnosis not present

## 2017-07-04 DIAGNOSIS — H353131 Nonexudative age-related macular degeneration, bilateral, early dry stage: Secondary | ICD-10-CM | POA: Diagnosis not present

## 2017-07-04 DIAGNOSIS — H5211 Myopia, right eye: Secondary | ICD-10-CM | POA: Diagnosis not present

## 2017-07-04 DIAGNOSIS — H5202 Hypermetropia, left eye: Secondary | ICD-10-CM | POA: Diagnosis not present

## 2017-07-04 DIAGNOSIS — H25012 Cortical age-related cataract, left eye: Secondary | ICD-10-CM | POA: Diagnosis not present

## 2017-07-04 DIAGNOSIS — H2512 Age-related nuclear cataract, left eye: Secondary | ICD-10-CM | POA: Diagnosis not present

## 2017-07-04 DIAGNOSIS — H524 Presbyopia: Secondary | ICD-10-CM | POA: Diagnosis not present

## 2017-07-04 DIAGNOSIS — H52201 Unspecified astigmatism, right eye: Secondary | ICD-10-CM | POA: Diagnosis not present

## 2017-07-20 DIAGNOSIS — Z1331 Encounter for screening for depression: Secondary | ICD-10-CM | POA: Diagnosis not present

## 2017-07-20 DIAGNOSIS — Z299 Encounter for prophylactic measures, unspecified: Secondary | ICD-10-CM | POA: Diagnosis not present

## 2017-07-20 DIAGNOSIS — Z79899 Other long term (current) drug therapy: Secondary | ICD-10-CM | POA: Diagnosis not present

## 2017-07-20 DIAGNOSIS — Z1339 Encounter for screening examination for other mental health and behavioral disorders: Secondary | ICD-10-CM | POA: Diagnosis not present

## 2017-07-20 DIAGNOSIS — Z6832 Body mass index (BMI) 32.0-32.9, adult: Secondary | ICD-10-CM | POA: Diagnosis not present

## 2017-07-20 DIAGNOSIS — R5383 Other fatigue: Secondary | ICD-10-CM | POA: Diagnosis not present

## 2017-07-20 DIAGNOSIS — E7800 Pure hypercholesterolemia, unspecified: Secondary | ICD-10-CM | POA: Diagnosis not present

## 2017-07-20 DIAGNOSIS — I10 Essential (primary) hypertension: Secondary | ICD-10-CM | POA: Diagnosis not present

## 2017-07-20 DIAGNOSIS — I2510 Atherosclerotic heart disease of native coronary artery without angina pectoris: Secondary | ICD-10-CM | POA: Diagnosis not present

## 2017-07-20 DIAGNOSIS — J069 Acute upper respiratory infection, unspecified: Secondary | ICD-10-CM | POA: Diagnosis not present

## 2017-07-20 DIAGNOSIS — Z0000 Encounter for general adult medical examination without abnormal findings: Secondary | ICD-10-CM | POA: Diagnosis not present

## 2017-07-20 DIAGNOSIS — Z7189 Other specified counseling: Secondary | ICD-10-CM | POA: Diagnosis not present

## 2017-07-27 DIAGNOSIS — F3181 Bipolar II disorder: Secondary | ICD-10-CM | POA: Diagnosis not present

## 2017-08-03 DIAGNOSIS — L110 Acquired keratosis follicularis: Secondary | ICD-10-CM | POA: Diagnosis not present

## 2017-08-03 DIAGNOSIS — I739 Peripheral vascular disease, unspecified: Secondary | ICD-10-CM | POA: Diagnosis not present

## 2017-08-03 DIAGNOSIS — L89892 Pressure ulcer of other site, stage 2: Secondary | ICD-10-CM | POA: Diagnosis not present

## 2017-08-03 DIAGNOSIS — B351 Tinea unguium: Secondary | ICD-10-CM | POA: Diagnosis not present

## 2017-08-22 DIAGNOSIS — H353132 Nonexudative age-related macular degeneration, bilateral, intermediate dry stage: Secondary | ICD-10-CM | POA: Diagnosis not present

## 2017-08-22 DIAGNOSIS — H2512 Age-related nuclear cataract, left eye: Secondary | ICD-10-CM | POA: Diagnosis not present

## 2017-08-22 DIAGNOSIS — H35362 Drusen (degenerative) of macula, left eye: Secondary | ICD-10-CM | POA: Diagnosis not present

## 2017-08-22 DIAGNOSIS — H35361 Drusen (degenerative) of macula, right eye: Secondary | ICD-10-CM | POA: Diagnosis not present

## 2017-09-28 DIAGNOSIS — H353131 Nonexudative age-related macular degeneration, bilateral, early dry stage: Secondary | ICD-10-CM | POA: Diagnosis not present

## 2017-09-28 DIAGNOSIS — H25812 Combined forms of age-related cataract, left eye: Secondary | ICD-10-CM | POA: Diagnosis not present

## 2017-09-28 DIAGNOSIS — H26491 Other secondary cataract, right eye: Secondary | ICD-10-CM | POA: Diagnosis not present

## 2017-09-29 DIAGNOSIS — E7800 Pure hypercholesterolemia, unspecified: Secondary | ICD-10-CM | POA: Diagnosis not present

## 2017-09-29 DIAGNOSIS — Z6833 Body mass index (BMI) 33.0-33.9, adult: Secondary | ICD-10-CM | POA: Diagnosis not present

## 2017-09-29 DIAGNOSIS — F319 Bipolar disorder, unspecified: Secondary | ICD-10-CM | POA: Diagnosis not present

## 2017-09-29 DIAGNOSIS — I10 Essential (primary) hypertension: Secondary | ICD-10-CM | POA: Diagnosis not present

## 2017-09-29 DIAGNOSIS — Z299 Encounter for prophylactic measures, unspecified: Secondary | ICD-10-CM | POA: Diagnosis not present

## 2017-09-29 DIAGNOSIS — I272 Pulmonary hypertension, unspecified: Secondary | ICD-10-CM | POA: Diagnosis not present

## 2017-09-29 DIAGNOSIS — L03119 Cellulitis of unspecified part of limb: Secondary | ICD-10-CM | POA: Diagnosis not present

## 2017-10-05 DIAGNOSIS — Z6833 Body mass index (BMI) 33.0-33.9, adult: Secondary | ICD-10-CM | POA: Diagnosis not present

## 2017-10-05 DIAGNOSIS — Z299 Encounter for prophylactic measures, unspecified: Secondary | ICD-10-CM | POA: Diagnosis not present

## 2017-10-05 DIAGNOSIS — R600 Localized edema: Secondary | ICD-10-CM | POA: Diagnosis not present

## 2017-10-05 DIAGNOSIS — L03119 Cellulitis of unspecified part of limb: Secondary | ICD-10-CM | POA: Diagnosis not present

## 2017-10-05 DIAGNOSIS — F319 Bipolar disorder, unspecified: Secondary | ICD-10-CM | POA: Diagnosis not present

## 2017-10-05 DIAGNOSIS — E7800 Pure hypercholesterolemia, unspecified: Secondary | ICD-10-CM | POA: Diagnosis not present

## 2017-10-05 DIAGNOSIS — I272 Pulmonary hypertension, unspecified: Secondary | ICD-10-CM | POA: Diagnosis not present

## 2017-10-05 DIAGNOSIS — Z789 Other specified health status: Secondary | ICD-10-CM | POA: Diagnosis not present

## 2017-10-12 DIAGNOSIS — Z881 Allergy status to other antibiotic agents status: Secondary | ICD-10-CM | POA: Diagnosis not present

## 2017-10-12 DIAGNOSIS — Z888 Allergy status to other drugs, medicaments and biological substances status: Secondary | ICD-10-CM | POA: Diagnosis not present

## 2017-10-12 DIAGNOSIS — E7800 Pure hypercholesterolemia, unspecified: Secondary | ICD-10-CM | POA: Diagnosis present

## 2017-10-12 DIAGNOSIS — L03116 Cellulitis of left lower limb: Secondary | ICD-10-CM | POA: Diagnosis not present

## 2017-10-12 DIAGNOSIS — F319 Bipolar disorder, unspecified: Secondary | ICD-10-CM | POA: Diagnosis present

## 2017-10-12 DIAGNOSIS — Z299 Encounter for prophylactic measures, unspecified: Secondary | ICD-10-CM | POA: Diagnosis not present

## 2017-10-12 DIAGNOSIS — I2510 Atherosclerotic heart disease of native coronary artery without angina pectoris: Secondary | ICD-10-CM | POA: Diagnosis not present

## 2017-10-12 DIAGNOSIS — M810 Age-related osteoporosis without current pathological fracture: Secondary | ICD-10-CM | POA: Diagnosis present

## 2017-10-12 DIAGNOSIS — I272 Pulmonary hypertension, unspecified: Secondary | ICD-10-CM | POA: Diagnosis not present

## 2017-10-12 DIAGNOSIS — Z882 Allergy status to sulfonamides status: Secondary | ICD-10-CM | POA: Diagnosis not present

## 2017-10-12 DIAGNOSIS — Z800 Family history of malignant neoplasm of digestive organs: Secondary | ICD-10-CM | POA: Diagnosis not present

## 2017-10-12 DIAGNOSIS — R600 Localized edema: Secondary | ICD-10-CM | POA: Diagnosis present

## 2017-10-12 DIAGNOSIS — Z880 Allergy status to penicillin: Secondary | ICD-10-CM | POA: Diagnosis not present

## 2017-10-12 DIAGNOSIS — Z6832 Body mass index (BMI) 32.0-32.9, adult: Secondary | ICD-10-CM | POA: Diagnosis not present

## 2017-10-12 DIAGNOSIS — I10 Essential (primary) hypertension: Secondary | ICD-10-CM | POA: Diagnosis present

## 2017-10-12 DIAGNOSIS — Z8249 Family history of ischemic heart disease and other diseases of the circulatory system: Secondary | ICD-10-CM | POA: Diagnosis not present

## 2017-10-12 DIAGNOSIS — L03115 Cellulitis of right lower limb: Secondary | ICD-10-CM | POA: Diagnosis not present

## 2017-10-19 DIAGNOSIS — L0390 Cellulitis, unspecified: Secondary | ICD-10-CM | POA: Diagnosis not present

## 2017-10-19 DIAGNOSIS — Z09 Encounter for follow-up examination after completed treatment for conditions other than malignant neoplasm: Secondary | ICD-10-CM | POA: Diagnosis not present

## 2017-10-19 DIAGNOSIS — Z6837 Body mass index (BMI) 37.0-37.9, adult: Secondary | ICD-10-CM | POA: Diagnosis not present

## 2017-10-19 DIAGNOSIS — F319 Bipolar disorder, unspecified: Secondary | ICD-10-CM | POA: Diagnosis not present

## 2017-10-19 DIAGNOSIS — R600 Localized edema: Secondary | ICD-10-CM | POA: Diagnosis not present

## 2017-10-26 DIAGNOSIS — L110 Acquired keratosis follicularis: Secondary | ICD-10-CM | POA: Diagnosis not present

## 2017-10-26 DIAGNOSIS — I739 Peripheral vascular disease, unspecified: Secondary | ICD-10-CM | POA: Diagnosis not present

## 2017-10-26 DIAGNOSIS — L89892 Pressure ulcer of other site, stage 2: Secondary | ICD-10-CM | POA: Diagnosis not present

## 2017-10-26 DIAGNOSIS — B351 Tinea unguium: Secondary | ICD-10-CM | POA: Diagnosis not present

## 2017-11-13 DIAGNOSIS — F3181 Bipolar II disorder: Secondary | ICD-10-CM | POA: Diagnosis not present

## 2017-11-23 NOTE — Patient Instructions (Signed)
Your procedure is scheduled on: 12/01/2017   Report to [facility] at  900   AM.  Call this number if you have problems the morning of surgery: [phone]   Do not eat food or drink liquids :After Midnight.      Take these medicines the morning of surgery with A SIP OF WATER: atenolol, cogentin, depakote, losartan.   Do not wear jewelry, make-up or nail polish.  Do not wear lotions, powders, or perfumes. You may wear deodorant.  Do not shave 48 hours prior to surgery.  Do not bring valuables to the hospital.  Contacts, dentures or bridgework may not be worn into surgery.  Leave suitcase in the car. After surgery it may be brought to your room.  For patients admitted to the hospital, checkout time is 11:00 AM the day of discharge.   Patients discharged the day of surgery will not be allowed to drive home.  :     Please read over the following fact sheets that you were given: Coughing and Deep Breathing, Surgical Site Infection Prevention, Anesthesia Post-op Instructions and Care and Recovery After Surgery    Cataract A cataract is a clouding of the lens of the eye. When a lens becomes cloudy, vision is reduced based on the degree and nature of the clouding. Many cataracts reduce vision to some degree. Some cataracts make people more near-sighted as they develop. Other cataracts increase glare. Cataracts that are ignored and become worse can sometimes look white. The white color can be seen through the pupil. CAUSES   Aging. However, cataracts may occur at any age, even in newborns.   Certain drugs.   Trauma to the eye.   Certain diseases such as diabetes.   Specific eye diseases such as chronic inflammation inside the eye or a sudden attack of a rare form of glaucoma.   Inherited or acquired medical problems.  SYMPTOMS   Gradual, progressive drop in vision in the affected eye.   Severe, rapid visual loss. This most often happens when trauma is the cause.  DIAGNOSIS  To detect  a cataract, an eye doctor examines the lens. Cataracts are best diagnosed with an exam of the eyes with the pupils enlarged (dilated) by drops.  TREATMENT  For an early cataract, vision may improve by using different eyeglasses or stronger lighting. If that does not help your vision, surgery is the only effective treatment. A cataract needs to be surgically removed when vision loss interferes with your everyday activities, such as driving, reading, or watching TV. A cataract may also have to be removed if it prevents examination or treatment of another eye problem. Surgery removes the cloudy lens and usually replaces it with a substitute lens (intraocular lens, IOL).  At a time when both you and your doctor agree, the cataract will be surgically removed. If you have cataracts in both eyes, only one is usually removed at a time. This allows the operated eye to heal and be out of danger from any possible problems after surgery (such as infection or poor wound healing). In rare cases, a cataract may be doing damage to your eye. In these cases, your caregiver may advise surgical removal right away. The vast majority of people who have cataract surgery have better vision afterward. HOME CARE INSTRUCTIONS  If you are not planning surgery, you may be asked to do the following:  Use different eyeglasses.   Use stronger or brighter lighting.   Ask your eye doctor about  reducing your medicine dose or changing medicines if it is thought that a medicine caused your cataract. Changing medicines does not make the cataract go away on its own.   Become familiar with your surroundings. Poor vision can lead to injury. Avoid bumping into things on the affected side. You are at a higher risk for tripping or falling.   Exercise extreme care when driving or operating machinery.   Wear sunglasses if you are sensitive to bright light or experiencing problems with glare.  SEEK IMMEDIATE MEDICAL CARE IF:   You have a  worsening or sudden vision loss.   You notice redness, swelling, or increasing pain in the eye.   You have a fever.  Document Released: 07/04/2005 Document Revised: 06/23/2011 Document Reviewed: 02/25/2011 [facility] Patient Information 2012 [place].PATIENT INSTRUCTIONS POST-ANESTHESIA  IMMEDIATELY FOLLOWING SURGERY:  Do not drive or operate machinery for the first twenty four hours after surgery.  Do not make any important decisions for twenty four hours after surgery or while taking narcotic pain medications or sedatives.  If you develop intractable nausea and vomiting or a severe headache please notify your doctor immediately.  FOLLOW-UP:  Please make an appointment with your surgeon as instructed. You do not need to follow up with anesthesia unless specifically instructed to do so.  WOUND CARE INSTRUCTIONS (if applicable):  Keep a dry clean dressing on the anesthesia/puncture wound site if there is drainage.  Once the wound has quit draining you may leave it open to air.  Generally you should leave the bandage intact for twenty four hours unless there is drainage.  If the epidural site drains for more than 36-48 hours please call the anesthesia department.  QUESTIONS?:  Please feel free to call your physician or the hospital operator if you have any questions, and they will be happy to assist you.

## 2017-11-24 ENCOUNTER — Inpatient Hospital Stay (HOSPITAL_COMMUNITY): Admission: RE | Admit: 2017-11-24 | Payer: Medicare Other | Source: Ambulatory Visit

## 2017-11-24 DIAGNOSIS — H25812 Combined forms of age-related cataract, left eye: Secondary | ICD-10-CM | POA: Diagnosis not present

## 2017-11-28 ENCOUNTER — Encounter (HOSPITAL_COMMUNITY): Payer: Self-pay | Admitting: *Deleted

## 2017-11-28 ENCOUNTER — Encounter (HOSPITAL_COMMUNITY)
Admission: RE | Admit: 2017-11-28 | Discharge: 2017-11-28 | Disposition: A | Discharge status: Home / Self Care | Payer: Medicare Other | Source: Ambulatory Visit | Attending: Ophthalmology | Admitting: Ophthalmology

## 2017-11-28 DIAGNOSIS — F419 Anxiety disorder, unspecified: Secondary | ICD-10-CM | POA: Diagnosis not present

## 2017-11-28 DIAGNOSIS — I10 Essential (primary) hypertension: Secondary | ICD-10-CM | POA: Diagnosis not present

## 2017-11-28 DIAGNOSIS — F0390 Unspecified dementia without behavioral disturbance: Secondary | ICD-10-CM | POA: Diagnosis not present

## 2017-11-28 DIAGNOSIS — Z882 Allergy status to sulfonamides status: Secondary | ICD-10-CM | POA: Diagnosis not present

## 2017-11-28 DIAGNOSIS — Z881 Allergy status to other antibiotic agents status: Secondary | ICD-10-CM | POA: Diagnosis not present

## 2017-11-28 DIAGNOSIS — I2510 Atherosclerotic heart disease of native coronary artery without angina pectoris: Secondary | ICD-10-CM | POA: Diagnosis not present

## 2017-11-28 DIAGNOSIS — I739 Peripheral vascular disease, unspecified: Secondary | ICD-10-CM | POA: Diagnosis not present

## 2017-11-28 DIAGNOSIS — I272 Pulmonary hypertension, unspecified: Secondary | ICD-10-CM | POA: Diagnosis not present

## 2017-11-28 DIAGNOSIS — J449 Chronic obstructive pulmonary disease, unspecified: Secondary | ICD-10-CM | POA: Diagnosis not present

## 2017-11-28 DIAGNOSIS — F319 Bipolar disorder, unspecified: Secondary | ICD-10-CM | POA: Diagnosis not present

## 2017-11-28 DIAGNOSIS — Z79899 Other long term (current) drug therapy: Secondary | ICD-10-CM | POA: Diagnosis not present

## 2017-11-28 DIAGNOSIS — Z880 Allergy status to penicillin: Secondary | ICD-10-CM | POA: Diagnosis not present

## 2017-11-28 DIAGNOSIS — H269 Unspecified cataract: Secondary | ICD-10-CM | POA: Diagnosis not present

## 2017-11-28 LAB — BASIC METABOLIC PANEL
Anion gap: 8 (ref 5–15)
BUN: 18 mg/dL (ref 6–20)
CALCIUM: 9 mg/dL (ref 8.9–10.3)
CHLORIDE: 105 mmol/L (ref 101–111)
CO2: 29 mmol/L (ref 22–32)
CREATININE: 0.88 mg/dL (ref 0.44–1.00)
GFR calc non Af Amer: 53 mL/min — ABNORMAL LOW (ref >60)
GLUCOSE: 100 mg/dL — AB (ref 65–99)
Potassium: 4.5 mmol/L (ref 3.5–5.1)
Sodium: 142 mmol/L (ref 135–145)

## 2017-11-28 LAB — CBC
HEMATOCRIT: 36.9 % (ref 36.0–46.0)
HEMOGLOBIN: 11.9 g/dL — AB (ref 12.0–15.0)
MCH: 30.2 pg (ref 26.0–34.0)
MCHC: 32.2 g/dL (ref 30.0–36.0)
MCV: 93.7 fL (ref 78.0–100.0)
Platelets: 186 10*3/uL (ref 150–400)
RBC: 3.94 MIL/uL (ref 3.87–5.11)
RDW: 14 % (ref 11.5–15.5)
WBC: 7.1 10*3/uL (ref 4.0–10.5)

## 2017-12-01 ENCOUNTER — Ambulatory Visit (HOSPITAL_COMMUNITY)
Admission: RE | Admit: 2017-12-01 | Discharge: 2017-12-01 | Disposition: A | Discharge status: Home / Self Care | Payer: Medicare Other | Source: Ambulatory Visit | Attending: Ophthalmology | Admitting: Ophthalmology

## 2017-12-01 ENCOUNTER — Ambulatory Visit (HOSPITAL_COMMUNITY): Payer: Medicare Other | Admitting: Anesthesiology

## 2017-12-01 ENCOUNTER — Encounter (HOSPITAL_COMMUNITY)
Admission: RE | Disposition: A | Discharge status: Home / Self Care | Payer: Self-pay | Source: Ambulatory Visit | Attending: Ophthalmology

## 2017-12-01 ENCOUNTER — Encounter (HOSPITAL_COMMUNITY): Payer: Self-pay

## 2017-12-01 DIAGNOSIS — I739 Peripheral vascular disease, unspecified: Secondary | ICD-10-CM | POA: Insufficient documentation

## 2017-12-01 DIAGNOSIS — J449 Chronic obstructive pulmonary disease, unspecified: Secondary | ICD-10-CM | POA: Insufficient documentation

## 2017-12-01 DIAGNOSIS — H269 Unspecified cataract: Secondary | ICD-10-CM | POA: Diagnosis not present

## 2017-12-01 DIAGNOSIS — I272 Pulmonary hypertension, unspecified: Secondary | ICD-10-CM | POA: Diagnosis not present

## 2017-12-01 DIAGNOSIS — F419 Anxiety disorder, unspecified: Secondary | ICD-10-CM | POA: Diagnosis not present

## 2017-12-01 DIAGNOSIS — I2510 Atherosclerotic heart disease of native coronary artery without angina pectoris: Secondary | ICD-10-CM | POA: Diagnosis not present

## 2017-12-01 DIAGNOSIS — Z880 Allergy status to penicillin: Secondary | ICD-10-CM | POA: Diagnosis not present

## 2017-12-01 DIAGNOSIS — I10 Essential (primary) hypertension: Secondary | ICD-10-CM | POA: Diagnosis not present

## 2017-12-01 DIAGNOSIS — Z79899 Other long term (current) drug therapy: Secondary | ICD-10-CM | POA: Insufficient documentation

## 2017-12-01 DIAGNOSIS — F0390 Unspecified dementia without behavioral disturbance: Secondary | ICD-10-CM | POA: Diagnosis not present

## 2017-12-01 DIAGNOSIS — Z882 Allergy status to sulfonamides status: Secondary | ICD-10-CM | POA: Insufficient documentation

## 2017-12-01 DIAGNOSIS — H25812 Combined forms of age-related cataract, left eye: Secondary | ICD-10-CM | POA: Diagnosis not present

## 2017-12-01 DIAGNOSIS — F319 Bipolar disorder, unspecified: Secondary | ICD-10-CM | POA: Diagnosis not present

## 2017-12-01 DIAGNOSIS — Z881 Allergy status to other antibiotic agents status: Secondary | ICD-10-CM | POA: Insufficient documentation

## 2017-12-01 DIAGNOSIS — H2512 Age-related nuclear cataract, left eye: Secondary | ICD-10-CM | POA: Diagnosis not present

## 2017-12-01 HISTORY — PX: CATARACT EXTRACTION W/PHACO: SHX586

## 2017-12-01 SURGERY — PHACOEMULSIFICATION, CATARACT, WITH IOL INSERTION
Anesthesia: Monitor Anesthesia Care | Site: Eye | Laterality: Left

## 2017-12-01 MED ORDER — LIDOCAINE HCL (PF) 1 % IJ SOLN
INTRAOCULAR | Status: DC | PRN
  Administered 2017-12-01: 1 mL via OPHTHALMIC

## 2017-12-01 MED ORDER — BSS IO SOLN
INTRAOCULAR | Status: DC | PRN
  Administered 2017-12-01: 15 mL

## 2017-12-01 MED ORDER — NEOMYCIN-POLYMYXIN-DEXAMETH 3.5-10000-0.1 OP SUSP
OPHTHALMIC | Status: DC | PRN
  Administered 2017-12-01: 2 [drp] via OPHTHALMIC

## 2017-12-01 MED ORDER — LACTATED RINGERS IV SOLN
INTRAVENOUS | Status: DC
  Administered 2017-12-01: 10:00:00 via INTRAVENOUS

## 2017-12-01 MED ORDER — CYCLOPENTOLATE-PHENYLEPHRINE 0.2-1 % OP SOLN
1.0000 [drp] | OPHTHALMIC | Status: AC
  Administered 2017-12-01 (×3): 1 [drp] via OPHTHALMIC

## 2017-12-01 MED ORDER — PROVISC 10 MG/ML IO SOLN
INTRAOCULAR | Status: DC | PRN
  Administered 2017-12-01: 0.85 mL via INTRAOCULAR

## 2017-12-01 MED ORDER — SODIUM HYALURONATE 23 MG/ML IO SOLN
INTRAOCULAR | Status: DC | PRN
  Administered 2017-12-01: 0.6 mL via INTRAOCULAR

## 2017-12-01 MED ORDER — PHENYLEPHRINE HCL 2.5 % OP SOLN
1.0000 [drp] | OPHTHALMIC | Status: AC
  Administered 2017-12-01 (×3): 1 [drp] via OPHTHALMIC

## 2017-12-01 MED ORDER — TETRACAINE HCL 0.5 % OP SOLN
1.0000 [drp] | OPHTHALMIC | Status: AC
  Administered 2017-12-01 (×3): 1 [drp] via OPHTHALMIC

## 2017-12-01 MED ORDER — POVIDONE-IODINE 5 % OP SOLN
OPHTHALMIC | Status: DC | PRN
  Administered 2017-12-01: 1 via OPHTHALMIC

## 2017-12-01 MED ORDER — MIDAZOLAM HCL 5 MG/5ML IJ SOLN
INTRAMUSCULAR | Status: DC | PRN
  Administered 2017-12-01: 0.5 mg via INTRAVENOUS

## 2017-12-01 MED ORDER — EPINEPHRINE PF 1 MG/ML IJ SOLN
INTRAMUSCULAR | Status: DC | PRN
  Administered 2017-12-01: 500 mL

## 2017-12-01 MED ORDER — MIDAZOLAM HCL 2 MG/2ML IJ SOLN
INTRAMUSCULAR | Status: AC
  Filled 2017-12-01: qty 2

## 2017-12-01 MED ORDER — LIDOCAINE HCL 3.5 % OP GEL
1.0000 "application " | Freq: Once | OPHTHALMIC | Status: AC
  Administered 2017-12-01: 1 via OPHTHALMIC

## 2017-12-01 SURGICAL SUPPLY — 15 items
CLOTH BEACON ORANGE TIMEOUT ST (SAFETY) ×2 IMPLANT
EYE SHIELD UNIVERSAL CLEAR (GAUZE/BANDAGES/DRESSINGS) ×2 IMPLANT
GLOVE BIOGEL PI IND STRL 6.5 (GLOVE) IMPLANT
GLOVE BIOGEL PI INDICATOR 6.5 (GLOVE) ×4
GLOVE SKINSENSE NS SZ7.5 (GLOVE) ×2
GLOVE SKINSENSE STRL SZ7.5 (GLOVE) IMPLANT
LENS ALC ACRYL/TECN (Ophthalmic Related) ×2 IMPLANT
NDL HYPO 18GX1.5 BLUNT FILL (NEEDLE) IMPLANT
NEEDLE HYPO 18GX1.5 BLUNT FILL (NEEDLE) ×3 IMPLANT
PAD ARMBOARD 7.5X6 YLW CONV (MISCELLANEOUS) ×2 IMPLANT
SYR TB 1ML LL NO SAFETY (SYRINGE) ×2 IMPLANT
TAPE SURG TRANSPORE 1 IN (GAUZE/BANDAGES/DRESSINGS) IMPLANT
TAPE SURGICAL TRANSPORE 1 IN (GAUZE/BANDAGES/DRESSINGS) ×2
VISCOELASTIC ADDITIONAL (OPHTHALMIC RELATED) ×2 IMPLANT
WATER STERILE IRR 250ML POUR (IV SOLUTION) ×2 IMPLANT

## 2017-12-01 NOTE — Anesthesia Preprocedure Evaluation (Signed)
Anesthesia Evaluation  Patient identified by MRN, date of birth, ID band Patient awake    Reviewed: Allergy & Precautions, H&P , NPO status , Patient's Chart, lab work & pertinent test results, reviewed documented beta blocker date and time   Airway Mallampati: III  TM Distance: >3 FB Neck ROM: full  Mouth opening: Limited Mouth Opening  Dental no notable dental hx. (+) Missing   Pulmonary neg pulmonary ROS, COPD,    Pulmonary exam normal breath sounds clear to auscultation       Cardiovascular Exercise Tolerance: Good hypertension, + CAD and + Peripheral Vascular Disease  negative cardio ROS  + Valvular Problems/Murmurs  Rhythm:regular Rate:Normal  Pulmonary hypertension    Neuro/Psych Bipolar Disorder negative neurological ROS  negative psych ROS   GI/Hepatic negative GI ROS, Neg liver ROS,   Endo/Other  negative endocrine ROS  Renal/GU negative Renal ROS  negative genitourinary   Musculoskeletal   Abdominal   Peds  Hematology negative hematology ROS (+)   Anesthesia Other Findings   Reproductive/Obstetrics negative OB ROS                             Anesthesia Physical Anesthesia Plan  ASA: III  Anesthesia Plan: MAC   Post-op Pain Management:    Induction:   PONV Risk Score and Plan:   Airway Management Planned:   Additional Equipment:   Intra-op Plan:   Post-operative Plan:   Informed Consent: I have reviewed the patients History and Physical, chart, labs and discussed the procedure including the risks, benefits and alternatives for the proposed anesthesia with the patient or authorized representative who has indicated his/her understanding and acceptance.   Dental Advisory Given  Plan Discussed with: CRNA  Anesthesia Plan Comments:         Anesthesia Quick Evaluation

## 2017-12-01 NOTE — Transfer of Care (Signed)
Immediate Anesthesia Transfer of Care Note  Patient: Isabella Campbell  Procedure(s) Performed: CATARACT EXTRACTION PHACO AND INTRAOCULAR LENS PLACEMENT ([place]) (Left Eye)  Patient Location: Short Stay  Anesthesia Type:MAC  Level of Consciousness: awake  Airway & Oxygen Therapy: Patient Spontanous Breathing  Post-op Assessment: Report given to RN  Post vital signs: Reviewed  Last Vitals:  Vitals Value Taken Time  BP    Temp    Pulse    Resp    SpO2      Last Pain:  Vitals:   12/01/17 0937  TempSrc: Oral  PainSc: 0-No pain         Complications: No apparent anesthesia complications

## 2017-12-01 NOTE — Anesthesia Postprocedure Evaluation (Signed)
Anesthesia Post Note  Patient: Isabella Campbell  Procedure(s) Performed: CATARACT EXTRACTION PHACO AND INTRAOCULAR LENS PLACEMENT ([place]) (Left Eye)  Patient location during evaluation: Short Stay Anesthesia Type: MAC Level of consciousness: awake and alert and oriented Pain management: pain level controlled Vital Signs Assessment: post-procedure vital signs reviewed and stable Respiratory status: spontaneous breathing Cardiovascular status: blood pressure returned to baseline and stable Postop Assessment: no apparent nausea or vomiting Anesthetic complications: no     Last Vitals:  Vitals:   12/01/17 0937  BP: (!) 171/74  Pulse: 68  Resp: 18  Temp: 36.8 C  SpO2: 100%    Last Pain:  Vitals:   12/01/17 0937  TempSrc: Oral  PainSc: 0-No pain                 Tyvion Edmondson

## 2017-12-01 NOTE — Op Note (Signed)
Date of procedure: 12/01/17  Pre-operative diagnosis: Visually significant cataract, Left Eye (H25.812)  Post-operative diagnosis: Visually significant cataract, Left Eye  Procedure: Removal of cataract via phacoemulsification and insertion of intra-ocular lens Johnson and [place]  +17.0D into the capsular bag of the Left Eye  Attending surgeon: Gerda Diss. Jerrard Bradburn, MD, MA  Anesthesia: MAC, Topical [place]  Complications: None  Estimated Blood Loss: <40m (minimal)  Specimens: None  Implants: As above  Indications:  Visually significant cataract, Left Eye  Procedure:  The patient was seen and identified in the pre-operative area. The operative eye was identified and dilated.  The operative eye was marked.  Topical anesthesia was administered to the operative eye.     The patient was then to the operative suite and placed in the supine position.  A timeout was performed confirming the patient, procedure to be performed, and all other relevant information.   The patient's face was prepped and draped in the usual fashion for intra-ocular surgery.  A lid speculum was placed into the operative eye and the surgical microscope moved into place and focused.  An inferotemporal paracentesis was created using a 20 gauge paracentesis blade.  Shugarcaine was injected into the anterior chamber.  Viscoelastic was injected into the anterior chamber.  A temporal clear-corneal main wound incision was created using a 2.493mmicrokeratome.  A continuous curvilinear capsulorrhexis was initiated using an irrigating cystitome and completed using capsulorrhexis forceps.  Hydrodissection and hydrodeliniation were performed.  Viscoelastic was injected into the anterior chamber.  A phacoemulsification handpiece and a chopper as a second instrument were used to remove the nucleus and epinucleus. The irrigation/aspiration handpiece was used to remove any remaining cortical material.   The capsular bag was  reinflated with viscoelastic, checked, and found to be intact.  The intraocular lens was inserted into the capsular bag and dialed into place using a Kuglen hook.  The irrigation/aspiration handpiece was used to remove any remaining viscoelastic.  The clear corneal wound and paracentesis wounds were then hydrated and checked with Weck-Cels to be watertight.  The lid-speculum and drape was removed, and the patient's face was cleaned with a wet and dry 4x4.  Maxitrol was instilled in the eye before a clear shield was taped over the eye. The patient was taken to the post-operative care unit in good condition, having tolerated the procedure well.  Post-Op Instructions: [facility] and will receive all other orders and instructions.

## 2017-12-01 NOTE — H&P (Signed)
The H and P was reviewed and updated. The patient was examined.  No changes were found after exam.  The surgical eye was marked.  

## 2017-12-04 ENCOUNTER — Encounter (HOSPITAL_COMMUNITY): Payer: Self-pay | Admitting: Ophthalmology

## 2018-01-05 DIAGNOSIS — Z299 Encounter for prophylactic measures, unspecified: Secondary | ICD-10-CM | POA: Diagnosis not present

## 2018-01-05 DIAGNOSIS — Z789 Other specified health status: Secondary | ICD-10-CM | POA: Diagnosis not present

## 2018-01-05 DIAGNOSIS — R05 Cough: Secondary | ICD-10-CM | POA: Diagnosis not present

## 2018-01-05 DIAGNOSIS — F319 Bipolar disorder, unspecified: Secondary | ICD-10-CM | POA: Diagnosis not present

## 2018-01-05 DIAGNOSIS — Z6832 Body mass index (BMI) 32.0-32.9, adult: Secondary | ICD-10-CM | POA: Diagnosis not present

## 2018-01-05 DIAGNOSIS — I10 Essential (primary) hypertension: Secondary | ICD-10-CM | POA: Diagnosis not present

## 2018-01-05 DIAGNOSIS — E7800 Pure hypercholesterolemia, unspecified: Secondary | ICD-10-CM | POA: Diagnosis not present

## 2018-01-24 DIAGNOSIS — B351 Tinea unguium: Secondary | ICD-10-CM | POA: Diagnosis not present

## 2018-01-24 DIAGNOSIS — L110 Acquired keratosis follicularis: Secondary | ICD-10-CM | POA: Diagnosis not present

## 2018-01-24 DIAGNOSIS — I739 Peripheral vascular disease, unspecified: Secondary | ICD-10-CM | POA: Diagnosis not present

## 2018-02-26 DIAGNOSIS — E7800 Pure hypercholesterolemia, unspecified: Secondary | ICD-10-CM | POA: Diagnosis not present

## 2018-02-26 DIAGNOSIS — I10 Essential (primary) hypertension: Secondary | ICD-10-CM | POA: Diagnosis not present

## 2018-03-15 DIAGNOSIS — Z6832 Body mass index (BMI) 32.0-32.9, adult: Secondary | ICD-10-CM | POA: Diagnosis not present

## 2018-03-15 DIAGNOSIS — Z299 Encounter for prophylactic measures, unspecified: Secondary | ICD-10-CM | POA: Diagnosis not present

## 2018-03-15 DIAGNOSIS — F319 Bipolar disorder, unspecified: Secondary | ICD-10-CM | POA: Diagnosis not present

## 2018-03-15 DIAGNOSIS — R42 Dizziness and giddiness: Secondary | ICD-10-CM | POA: Diagnosis not present

## 2018-03-15 DIAGNOSIS — I10 Essential (primary) hypertension: Secondary | ICD-10-CM | POA: Diagnosis not present

## 2018-03-15 DIAGNOSIS — E7800 Pure hypercholesterolemia, unspecified: Secondary | ICD-10-CM | POA: Diagnosis not present

## 2018-03-21 DIAGNOSIS — F319 Bipolar disorder, unspecified: Secondary | ICD-10-CM | POA: Diagnosis not present

## 2018-03-21 DIAGNOSIS — R2689 Other abnormalities of gait and mobility: Secondary | ICD-10-CM | POA: Diagnosis not present

## 2018-03-21 DIAGNOSIS — I270 Primary pulmonary hypertension: Secondary | ICD-10-CM | POA: Diagnosis not present

## 2018-03-29 DIAGNOSIS — I270 Primary pulmonary hypertension: Secondary | ICD-10-CM | POA: Diagnosis not present

## 2018-03-29 DIAGNOSIS — R2689 Other abnormalities of gait and mobility: Secondary | ICD-10-CM | POA: Diagnosis not present

## 2018-03-29 DIAGNOSIS — F319 Bipolar disorder, unspecified: Secondary | ICD-10-CM | POA: Diagnosis not present

## 2018-03-30 DIAGNOSIS — F319 Bipolar disorder, unspecified: Secondary | ICD-10-CM | POA: Diagnosis not present

## 2018-03-30 DIAGNOSIS — I270 Primary pulmonary hypertension: Secondary | ICD-10-CM | POA: Diagnosis not present

## 2018-03-30 DIAGNOSIS — R2689 Other abnormalities of gait and mobility: Secondary | ICD-10-CM | POA: Diagnosis not present

## 2018-04-02 DIAGNOSIS — I872 Venous insufficiency (chronic) (peripheral): Secondary | ICD-10-CM | POA: Diagnosis not present

## 2018-04-02 DIAGNOSIS — I270 Primary pulmonary hypertension: Secondary | ICD-10-CM | POA: Diagnosis not present

## 2018-04-02 DIAGNOSIS — L308 Other specified dermatitis: Secondary | ICD-10-CM | POA: Diagnosis not present

## 2018-04-02 DIAGNOSIS — F319 Bipolar disorder, unspecified: Secondary | ICD-10-CM | POA: Diagnosis not present

## 2018-04-02 DIAGNOSIS — R2689 Other abnormalities of gait and mobility: Secondary | ICD-10-CM | POA: Diagnosis not present

## 2018-04-04 DIAGNOSIS — R2689 Other abnormalities of gait and mobility: Secondary | ICD-10-CM | POA: Diagnosis not present

## 2018-04-04 DIAGNOSIS — F319 Bipolar disorder, unspecified: Secondary | ICD-10-CM | POA: Diagnosis not present

## 2018-04-04 DIAGNOSIS — I270 Primary pulmonary hypertension: Secondary | ICD-10-CM | POA: Diagnosis not present

## 2018-04-06 DIAGNOSIS — E7800 Pure hypercholesterolemia, unspecified: Secondary | ICD-10-CM | POA: Diagnosis not present

## 2018-04-06 DIAGNOSIS — I10 Essential (primary) hypertension: Secondary | ICD-10-CM | POA: Diagnosis not present

## 2018-04-10 DIAGNOSIS — I7389 Other specified peripheral vascular diseases: Secondary | ICD-10-CM | POA: Diagnosis not present

## 2018-04-10 DIAGNOSIS — H81393 Other peripheral vertigo, bilateral: Secondary | ICD-10-CM | POA: Diagnosis not present

## 2018-04-10 DIAGNOSIS — F319 Bipolar disorder, unspecified: Secondary | ICD-10-CM | POA: Diagnosis not present

## 2018-04-10 DIAGNOSIS — R2689 Other abnormalities of gait and mobility: Secondary | ICD-10-CM | POA: Diagnosis not present

## 2018-04-10 DIAGNOSIS — I270 Primary pulmonary hypertension: Secondary | ICD-10-CM | POA: Diagnosis not present

## 2018-04-10 DIAGNOSIS — G9009 Other idiopathic peripheral autonomic neuropathy: Secondary | ICD-10-CM | POA: Diagnosis not present

## 2018-04-11 DIAGNOSIS — L110 Acquired keratosis follicularis: Secondary | ICD-10-CM | POA: Diagnosis not present

## 2018-04-11 DIAGNOSIS — B351 Tinea unguium: Secondary | ICD-10-CM | POA: Diagnosis not present

## 2018-04-11 DIAGNOSIS — I739 Peripheral vascular disease, unspecified: Secondary | ICD-10-CM | POA: Diagnosis not present

## 2018-04-12 DIAGNOSIS — R2689 Other abnormalities of gait and mobility: Secondary | ICD-10-CM | POA: Diagnosis not present

## 2018-04-12 DIAGNOSIS — I270 Primary pulmonary hypertension: Secondary | ICD-10-CM | POA: Diagnosis not present

## 2018-04-12 DIAGNOSIS — F319 Bipolar disorder, unspecified: Secondary | ICD-10-CM | POA: Diagnosis not present

## 2018-04-16 DIAGNOSIS — I270 Primary pulmonary hypertension: Secondary | ICD-10-CM | POA: Diagnosis not present

## 2018-04-16 DIAGNOSIS — F319 Bipolar disorder, unspecified: Secondary | ICD-10-CM | POA: Diagnosis not present

## 2018-04-16 DIAGNOSIS — R2689 Other abnormalities of gait and mobility: Secondary | ICD-10-CM | POA: Diagnosis not present

## 2018-04-20 DIAGNOSIS — I270 Primary pulmonary hypertension: Secondary | ICD-10-CM | POA: Diagnosis not present

## 2018-04-20 DIAGNOSIS — F319 Bipolar disorder, unspecified: Secondary | ICD-10-CM | POA: Diagnosis not present

## 2018-04-20 DIAGNOSIS — R2689 Other abnormalities of gait and mobility: Secondary | ICD-10-CM | POA: Diagnosis not present

## 2018-04-23 DIAGNOSIS — F3181 Bipolar II disorder: Secondary | ICD-10-CM | POA: Diagnosis not present

## 2018-04-24 DIAGNOSIS — I270 Primary pulmonary hypertension: Secondary | ICD-10-CM | POA: Diagnosis not present

## 2018-04-24 DIAGNOSIS — F319 Bipolar disorder, unspecified: Secondary | ICD-10-CM | POA: Diagnosis not present

## 2018-04-24 DIAGNOSIS — R2689 Other abnormalities of gait and mobility: Secondary | ICD-10-CM | POA: Diagnosis not present

## 2018-04-26 DIAGNOSIS — R2689 Other abnormalities of gait and mobility: Secondary | ICD-10-CM | POA: Diagnosis not present

## 2018-04-26 DIAGNOSIS — I270 Primary pulmonary hypertension: Secondary | ICD-10-CM | POA: Diagnosis not present

## 2018-04-26 DIAGNOSIS — F319 Bipolar disorder, unspecified: Secondary | ICD-10-CM | POA: Diagnosis not present

## 2018-04-30 DIAGNOSIS — R2689 Other abnormalities of gait and mobility: Secondary | ICD-10-CM | POA: Diagnosis not present

## 2018-04-30 DIAGNOSIS — I270 Primary pulmonary hypertension: Secondary | ICD-10-CM | POA: Diagnosis not present

## 2018-04-30 DIAGNOSIS — F319 Bipolar disorder, unspecified: Secondary | ICD-10-CM | POA: Diagnosis not present

## 2018-05-02 DIAGNOSIS — Z23 Encounter for immunization: Secondary | ICD-10-CM | POA: Diagnosis not present

## 2018-05-09 DIAGNOSIS — I10 Essential (primary) hypertension: Secondary | ICD-10-CM | POA: Diagnosis not present

## 2018-05-09 DIAGNOSIS — F319 Bipolar disorder, unspecified: Secondary | ICD-10-CM | POA: Diagnosis not present

## 2018-05-09 DIAGNOSIS — Z6832 Body mass index (BMI) 32.0-32.9, adult: Secondary | ICD-10-CM | POA: Diagnosis not present

## 2018-05-09 DIAGNOSIS — Z299 Encounter for prophylactic measures, unspecified: Secondary | ICD-10-CM | POA: Diagnosis not present

## 2018-05-09 DIAGNOSIS — R42 Dizziness and giddiness: Secondary | ICD-10-CM | POA: Diagnosis not present

## 2018-05-15 DIAGNOSIS — I270 Primary pulmonary hypertension: Secondary | ICD-10-CM | POA: Diagnosis not present

## 2018-05-15 DIAGNOSIS — F319 Bipolar disorder, unspecified: Secondary | ICD-10-CM | POA: Diagnosis not present

## 2018-05-15 DIAGNOSIS — R2689 Other abnormalities of gait and mobility: Secondary | ICD-10-CM | POA: Diagnosis not present

## 2018-06-07 DIAGNOSIS — H43813 Vitreous degeneration, bilateral: Secondary | ICD-10-CM | POA: Diagnosis not present

## 2018-06-07 DIAGNOSIS — H26492 Other secondary cataract, left eye: Secondary | ICD-10-CM | POA: Diagnosis not present

## 2018-06-07 DIAGNOSIS — H10403 Unspecified chronic conjunctivitis, bilateral: Secondary | ICD-10-CM | POA: Diagnosis not present

## 2018-06-20 DIAGNOSIS — L110 Acquired keratosis follicularis: Secondary | ICD-10-CM | POA: Diagnosis not present

## 2018-06-20 DIAGNOSIS — B351 Tinea unguium: Secondary | ICD-10-CM | POA: Diagnosis not present

## 2018-06-20 DIAGNOSIS — I739 Peripheral vascular disease, unspecified: Secondary | ICD-10-CM | POA: Diagnosis not present

## 2018-08-29 DIAGNOSIS — L110 Acquired keratosis follicularis: Secondary | ICD-10-CM | POA: Diagnosis not present

## 2018-08-29 DIAGNOSIS — I739 Peripheral vascular disease, unspecified: Secondary | ICD-10-CM | POA: Diagnosis not present

## 2018-08-29 DIAGNOSIS — B351 Tinea unguium: Secondary | ICD-10-CM | POA: Diagnosis not present

## 2018-08-30 DIAGNOSIS — H26492 Other secondary cataract, left eye: Secondary | ICD-10-CM | POA: Diagnosis not present

## 2018-08-30 DIAGNOSIS — H10403 Unspecified chronic conjunctivitis, bilateral: Secondary | ICD-10-CM | POA: Diagnosis not present

## 2019-01-04 DIAGNOSIS — L03119 Cellulitis of unspecified part of limb: Secondary | ICD-10-CM | POA: Diagnosis not present

## 2019-01-04 DIAGNOSIS — I10 Essential (primary) hypertension: Secondary | ICD-10-CM | POA: Diagnosis not present

## 2019-01-04 DIAGNOSIS — F319 Bipolar disorder, unspecified: Secondary | ICD-10-CM | POA: Diagnosis not present

## 2019-01-04 DIAGNOSIS — Z299 Encounter for prophylactic measures, unspecified: Secondary | ICD-10-CM | POA: Diagnosis not present

## 2019-01-04 DIAGNOSIS — Z713 Dietary counseling and surveillance: Secondary | ICD-10-CM | POA: Diagnosis not present

## 2019-06-18 DIAGNOSIS — I10 Essential (primary) hypertension: Secondary | ICD-10-CM | POA: Diagnosis not present

## 2019-06-18 DIAGNOSIS — E7800 Pure hypercholesterolemia, unspecified: Secondary | ICD-10-CM | POA: Diagnosis not present

## 2019-07-11 ENCOUNTER — Emergency Department (HOSPITAL_COMMUNITY): Payer: Medicare Other

## 2019-07-11 ENCOUNTER — Encounter (HOSPITAL_COMMUNITY): Payer: Self-pay

## 2019-07-11 ENCOUNTER — Other Ambulatory Visit: Payer: Self-pay

## 2019-07-11 ENCOUNTER — Inpatient Hospital Stay (HOSPITAL_COMMUNITY)
Admission: EM | Admit: 2019-07-11 | Discharge: 2019-07-22 | DRG: 208 | Disposition: A | Discharge status: Skilled Nursing Facility | Payer: Medicare Other | Source: Skilled Nursing Facility | Attending: Internal Medicine | Admitting: Internal Medicine

## 2019-07-11 DIAGNOSIS — J9601 Acute respiratory failure with hypoxia: Secondary | ICD-10-CM | POA: Diagnosis present

## 2019-07-11 DIAGNOSIS — J410 Simple chronic bronchitis: Secondary | ICD-10-CM | POA: Diagnosis not present

## 2019-07-11 DIAGNOSIS — J440 Chronic obstructive pulmonary disease with acute lower respiratory infection: Secondary | ICD-10-CM | POA: Diagnosis present

## 2019-07-11 DIAGNOSIS — F0390 Unspecified dementia without behavioral disturbance: Secondary | ICD-10-CM | POA: Diagnosis present

## 2019-07-11 DIAGNOSIS — A419 Sepsis, unspecified organism: Secondary | ICD-10-CM | POA: Diagnosis not present

## 2019-07-11 DIAGNOSIS — D72810 Lymphocytopenia: Secondary | ICD-10-CM | POA: Diagnosis present

## 2019-07-11 DIAGNOSIS — U071 COVID-19: Secondary | ICD-10-CM | POA: Diagnosis present

## 2019-07-11 DIAGNOSIS — J449 Chronic obstructive pulmonary disease, unspecified: Secondary | ICD-10-CM | POA: Diagnosis present

## 2019-07-11 DIAGNOSIS — R262 Difficulty in walking, not elsewhere classified: Secondary | ICD-10-CM | POA: Diagnosis not present

## 2019-07-11 DIAGNOSIS — I959 Hypotension, unspecified: Secondary | ICD-10-CM | POA: Diagnosis not present

## 2019-07-11 DIAGNOSIS — E669 Obesity, unspecified: Secondary | ICD-10-CM | POA: Diagnosis present

## 2019-07-11 DIAGNOSIS — E8809 Other disorders of plasma-protein metabolism, not elsewhere classified: Secondary | ICD-10-CM | POA: Diagnosis present

## 2019-07-11 DIAGNOSIS — M6281 Muscle weakness (generalized): Secondary | ICD-10-CM | POA: Diagnosis not present

## 2019-07-11 DIAGNOSIS — F419 Anxiety disorder, unspecified: Secondary | ICD-10-CM | POA: Diagnosis present

## 2019-07-11 DIAGNOSIS — L89151 Pressure ulcer of sacral region, stage 1: Secondary | ICD-10-CM | POA: Diagnosis present

## 2019-07-11 DIAGNOSIS — I272 Pulmonary hypertension, unspecified: Secondary | ICD-10-CM | POA: Diagnosis present

## 2019-07-11 DIAGNOSIS — R1310 Dysphagia, unspecified: Secondary | ICD-10-CM | POA: Diagnosis not present

## 2019-07-11 DIAGNOSIS — I10 Essential (primary) hypertension: Secondary | ICD-10-CM | POA: Diagnosis present

## 2019-07-11 DIAGNOSIS — Z882 Allergy status to sulfonamides status: Secondary | ICD-10-CM

## 2019-07-11 DIAGNOSIS — Z880 Allergy status to penicillin: Secondary | ICD-10-CM

## 2019-07-11 DIAGNOSIS — D696 Thrombocytopenia, unspecified: Secondary | ICD-10-CM | POA: Diagnosis present

## 2019-07-11 DIAGNOSIS — J431 Panlobular emphysema: Secondary | ICD-10-CM | POA: Diagnosis not present

## 2019-07-11 DIAGNOSIS — M2550 Pain in unspecified joint: Secondary | ICD-10-CM | POA: Diagnosis not present

## 2019-07-11 DIAGNOSIS — R410 Disorientation, unspecified: Secondary | ICD-10-CM | POA: Diagnosis not present

## 2019-07-11 DIAGNOSIS — F319 Bipolar disorder, unspecified: Secondary | ICD-10-CM | POA: Diagnosis present

## 2019-07-11 DIAGNOSIS — N390 Urinary tract infection, site not specified: Secondary | ICD-10-CM | POA: Diagnosis present

## 2019-07-11 DIAGNOSIS — Z888 Allergy status to other drugs, medicaments and biological substances status: Secondary | ICD-10-CM

## 2019-07-11 DIAGNOSIS — Z8249 Family history of ischemic heart disease and other diseases of the circulatory system: Secondary | ICD-10-CM

## 2019-07-11 DIAGNOSIS — R4182 Altered mental status, unspecified: Secondary | ICD-10-CM | POA: Diagnosis present

## 2019-07-11 DIAGNOSIS — L8990 Pressure ulcer of unspecified site, unspecified stage: Secondary | ICD-10-CM | POA: Diagnosis present

## 2019-07-11 DIAGNOSIS — Z9841 Cataract extraction status, right eye: Secondary | ICD-10-CM

## 2019-07-11 DIAGNOSIS — I2510 Atherosclerotic heart disease of native coronary artery without angina pectoris: Secondary | ICD-10-CM | POA: Diagnosis present

## 2019-07-11 DIAGNOSIS — G9349 Other encephalopathy: Secondary | ICD-10-CM | POA: Diagnosis present

## 2019-07-11 DIAGNOSIS — J80 Acute respiratory distress syndrome: Secondary | ICD-10-CM | POA: Diagnosis present

## 2019-07-11 DIAGNOSIS — J1282 Pneumonia due to coronavirus disease 2019: Secondary | ICD-10-CM | POA: Diagnosis present

## 2019-07-11 DIAGNOSIS — Z66 Do not resuscitate: Secondary | ICD-10-CM | POA: Diagnosis not present

## 2019-07-11 DIAGNOSIS — H9190 Unspecified hearing loss, unspecified ear: Secondary | ICD-10-CM | POA: Diagnosis present

## 2019-07-11 DIAGNOSIS — R0902 Hypoxemia: Secondary | ICD-10-CM

## 2019-07-11 DIAGNOSIS — R400 Somnolence: Secondary | ICD-10-CM | POA: Diagnosis not present

## 2019-07-11 DIAGNOSIS — Z881 Allergy status to other antibiotic agents status: Secondary | ICD-10-CM

## 2019-07-11 DIAGNOSIS — Z6830 Body mass index (BMI) 30.0-30.9, adult: Secondary | ICD-10-CM

## 2019-07-11 DIAGNOSIS — Z79899 Other long term (current) drug therapy: Secondary | ICD-10-CM

## 2019-07-11 DIAGNOSIS — J1289 Other viral pneumonia: Secondary | ICD-10-CM | POA: Diagnosis not present

## 2019-07-11 DIAGNOSIS — Z7401 Bed confinement status: Secondary | ICD-10-CM | POA: Diagnosis not present

## 2019-07-11 DIAGNOSIS — Z9842 Cataract extraction status, left eye: Secondary | ICD-10-CM | POA: Diagnosis not present

## 2019-07-11 DIAGNOSIS — Z87440 Personal history of urinary (tract) infections: Secondary | ICD-10-CM

## 2019-07-11 DIAGNOSIS — Z961 Presence of intraocular lens: Secondary | ICD-10-CM | POA: Diagnosis present

## 2019-07-11 DIAGNOSIS — Z9981 Dependence on supplemental oxygen: Secondary | ICD-10-CM | POA: Diagnosis not present

## 2019-07-11 LAB — COMPREHENSIVE METABOLIC PANEL
ALT: 13 U/L (ref 0–44)
AST: 31 U/L (ref 15–41)
Albumin: 2.9 g/dL — ABNORMAL LOW (ref 3.5–5.0)
Alkaline Phosphatase: 49 U/L (ref 38–126)
Anion gap: 8 (ref 5–15)
BUN: 21 mg/dL (ref 8–23)
CO2: 25 mmol/L (ref 22–32)
Calcium: 8.2 mg/dL — ABNORMAL LOW (ref 8.9–10.3)
Chloride: 100 mmol/L (ref 98–111)
Creatinine, Ser: 1.03 mg/dL — ABNORMAL HIGH (ref 0.44–1.00)
GFR calc Af Amer: 52 mL/min — ABNORMAL LOW (ref >60)
GFR calc non Af Amer: 45 mL/min — ABNORMAL LOW (ref >60)
Glucose, Bld: 143 mg/dL — ABNORMAL HIGH (ref 70–99)
Potassium: 4.1 mmol/L (ref 3.5–5.1)
Sodium: 133 mmol/L — ABNORMAL LOW (ref 135–145)
Total Bilirubin: 0.5 mg/dL (ref 0.3–1.2)
Total Protein: 6.2 g/dL — ABNORMAL LOW (ref 6.5–8.1)

## 2019-07-11 LAB — CBC WITH DIFFERENTIAL/PLATELET
Abs Immature Granulocytes: 0.03 10*3/uL (ref 0.00–0.07)
Basophils Absolute: 0 10*3/uL (ref 0.0–0.1)
Basophils Relative: 0 %
Eosinophils Absolute: 0 10*3/uL (ref 0.0–0.5)
Eosinophils Relative: 0 %
HCT: 39.2 % (ref 36.0–46.0)
Hemoglobin: 12.5 g/dL (ref 12.0–15.0)
Immature Granulocytes: 1 %
Lymphocytes Relative: 17 %
Lymphs Abs: 0.5 10*3/uL — ABNORMAL LOW (ref 0.7–4.0)
MCH: 30.2 pg (ref 26.0–34.0)
MCHC: 31.9 g/dL (ref 30.0–36.0)
MCV: 94.7 fL (ref 80.0–100.0)
Monocytes Absolute: 0.3 10*3/uL (ref 0.1–1.0)
Monocytes Relative: 12 %
Neutro Abs: 1.8 10*3/uL (ref 1.7–7.7)
Neutrophils Relative %: 70 %
Platelets: 115 10*3/uL — ABNORMAL LOW (ref 150–400)
RBC: 4.14 MIL/uL (ref 3.87–5.11)
RDW: 13.1 % (ref 11.5–15.5)
WBC: 2.6 10*3/uL — ABNORMAL LOW (ref 4.0–10.5)
nRBC: 0 % (ref 0.0–0.2)

## 2019-07-11 LAB — POC SARS CORONAVIRUS 2 AG -  ED: SARS Coronavirus 2 Ag: POSITIVE — AB

## 2019-07-11 LAB — FIBRINOGEN: Fibrinogen: 516 mg/dL — ABNORMAL HIGH (ref 210–475)

## 2019-07-11 LAB — PROCALCITONIN: Procalcitonin: <0.1 ng/mL

## 2019-07-11 LAB — LACTIC ACID, PLASMA: Lactic Acid, Venous: 1.9 mmol/L (ref 0.5–1.9)

## 2019-07-11 LAB — APTT: aPTT: 37 seconds — ABNORMAL HIGH (ref 24–36)

## 2019-07-11 LAB — LACTATE DEHYDROGENASE: LDH: 260 U/L — ABNORMAL HIGH (ref 98–192)

## 2019-07-11 LAB — PROTIME-INR
INR: 1.1 (ref 0.8–1.2)
Prothrombin Time: 13.9 seconds (ref 11.4–15.2)

## 2019-07-11 LAB — TRIGLYCERIDES: Triglycerides: 71 mg/dL (ref <150)

## 2019-07-11 LAB — D-DIMER, QUANTITATIVE: D-Dimer, Quant: 1.46 ug/mL-FEU — ABNORMAL HIGH (ref 0.00–0.50)

## 2019-07-11 LAB — FERRITIN: Ferritin: 230 ng/mL (ref 11–307)

## 2019-07-11 LAB — C-REACTIVE PROTEIN: CRP: 4.9 mg/dL — ABNORMAL HIGH (ref <1.0)

## 2019-07-11 MED ORDER — GUAIFENESIN-DM 100-10 MG/5ML PO SYRP
10.0000 mL | ORAL_SOLUTION | ORAL | Status: DC | PRN
  Administered 2019-07-19: 10 mL via ORAL
  Filled 2019-07-11: qty 10

## 2019-07-11 MED ORDER — HYDROCOD POLST-CPM POLST ER 10-8 MG/5ML PO SUER: 5.0000 mL | Freq: Two times a day (BID) | ORAL | Status: DC | PRN

## 2019-07-11 MED ORDER — ZINC SULFATE 220 (50 ZN) MG PO CAPS
220.0000 mg | ORAL_CAPSULE | Freq: Every day | ORAL | Status: DC
  Administered 2019-07-12 – 2019-07-22 (×10): 220 mg via ORAL
  Filled 2019-07-11 (×11): qty 1

## 2019-07-11 MED ORDER — ENOXAPARIN SODIUM 40 MG/0.4ML ~~LOC~~ SOLN
40.0000 mg | Freq: Every day | SUBCUTANEOUS | Status: DC
  Administered 2019-07-11: 40 mg via SUBCUTANEOUS
  Filled 2019-07-11: qty 0.4

## 2019-07-11 MED ORDER — SODIUM CHLORIDE 0.9 % IV SOLN
500.0000 mg | INTRAVENOUS | Status: DC
  Administered 2019-07-11 – 2019-07-12 (×2): 500 mg via INTRAVENOUS
  Filled 2019-07-11 (×2): qty 500

## 2019-07-11 MED ORDER — SODIUM CHLORIDE 0.9 % IV SOLN
2.0000 g | Freq: Once | INTRAVENOUS | Status: AC
  Administered 2019-07-11: 2 g via INTRAVENOUS
  Filled 2019-07-11: qty 20

## 2019-07-11 MED ORDER — DEXAMETHASONE SODIUM PHOSPHATE 10 MG/ML IJ SOLN
6.0000 mg | Freq: Every day | INTRAMUSCULAR | Status: AC
  Administered 2019-07-11 – 2019-07-20 (×10): 6 mg via INTRAVENOUS
  Filled 2019-07-11 (×10): qty 1

## 2019-07-11 MED ORDER — DEXAMETHASONE SODIUM PHOSPHATE 10 MG/ML IJ SOLN: 6.0000 mg | Freq: Once | INTRAMUSCULAR | Status: DC

## 2019-07-11 MED ORDER — ACETAMINOPHEN 325 MG PO TABS
650.0000 mg | ORAL_TABLET | Freq: Four times a day (QID) | ORAL | Status: DC | PRN
  Administered 2019-07-13: 650 mg via ORAL
  Filled 2019-07-11: qty 2

## 2019-07-11 MED ORDER — ASCORBIC ACID 500 MG PO TABS
500.0000 mg | ORAL_TABLET | Freq: Every day | ORAL | Status: DC
  Administered 2019-07-12 – 2019-07-22 (×10): 500 mg via ORAL
  Filled 2019-07-11 (×10): qty 1

## 2019-07-11 MED ORDER — ALBUTEROL SULFATE HFA 108 (90 BASE) MCG/ACT IN AERS
2.0000 | INHALATION_SPRAY | Freq: Four times a day (QID) | RESPIRATORY_TRACT | Status: DC
  Administered 2019-07-11 – 2019-07-12 (×2): 2 via RESPIRATORY_TRACT
  Filled 2019-07-11 (×2): qty 6.7

## 2019-07-11 NOTE — ED Triage Notes (Signed)
Pt sent from [facility] facility for altered mental status.  Pt recently diagnosed with uti and has started on keflex.

## 2019-07-11 NOTE — H&P (Signed)
History and Physical  Isabella Campbell ZOX:[id number] DOB: 1918/07/15 DOA: 07/11/2019  Referring physician: Kennis Carina MD PCP: Ignatius Specking, MD  Patient coming from: [facility]  Chief Complaint: Altered mental status  HPI: Isabella Campbell is a 85 y.o. female from [facility] facility with medical history significant for COPD, CAD who was sent to the ED due to altered mental status.  Baseline mental status unknown at this time due to patient being unable to provide history.  History was obtained from ED physician and ED chart.  Per report, patient was recently diagnosed with UTI and was started on Keflex.  ED Course: In the ED emergency room, patient was noted to be febrile with a temperature of 100.5, she was tachycardic and tachypneic with a BP of 175/84, O2 sats was 94% on supplemental oxygen via [place] at 2 LPM.  Work-up in the ED showed leukopenia, thrombocytopenia, hypoalbuminemia.  CRP was 4.9, D-dimer 1.46, fibrinogen 516, LDH 260, procalcitonin was negative, ferritin 230. POC SARS coronavirus 2 Ag test positive.  Chest x-ray shows COPD without acute airspace disease.  She was initially treated with IV ceftriaxone due to presumed CAP.  Hospitalist was asked to admit patient for further evaluation and management.  Review of Systems:  This cannot be obtained at this time due to patient's current condition   Past Medical History:  Diagnosis Date  . Bipolar 1 disorder (HCC)   . COPD (chronic obstructive pulmonary disease) (HCC)    CT chest 11/2005 Dr. Orson Aloe  . Coronary artery disease    minimal bilateral disease  . Hypertension   . Pulmonary hypertension (HCC)    Past Surgical History:  Procedure Laterality Date  . CATARACT EXTRACTION Right   . CATARACT EXTRACTION W/PHACO Left 12/01/2017   Procedure: CATARACT EXTRACTION PHACO AND INTRAOCULAR LENS PLACEMENT ([facility]);  Surgeon: Fabio Pierce, MD;  Location: [facility];  Service: Ophthalmology;  Laterality: Left;   CDE: 11.86  . DILATION AND CURETTAGE OF UTERUS    . hemrrhoidectomy      Social History:  reports that she has never smoked. She has never used smokeless tobacco. She reports that she does not drink alcohol or use drugs.   Allergies  Allergen Reactions  . Doxycycline Hives  . Penicillins Hives  . Quetiapine     REACTION: involuntary licking of tongue against teeth, felt really bad on med  . Sulfonamide Derivatives Nausea And Vomiting    Family History  Problem Relation Age of Onset  . Cancer Other   . Coronary artery disease Other      Prior to Admission medications   Medication Sig Start Date End Date Taking? Authorizing Provider  acetaminophen (TYLENOL) 325 MG tablet Take 650 mg by mouth every 6 (six) hours as needed.   Yes [provider]  Ascorbic Acid (VITAMIN C) 1000 MG tablet Take 1,000 mg by mouth daily.   Yes [provider]  atenolol (TENORMIN) 25 MG tablet Take 25 mg by mouth daily.    Yes [provider]  benztropine (COGENTIN) 0.5 MG tablet Take 0.5 mg by mouth daily.    Yes [provider]  Cholecalciferol 1000 units tablet Take 1,000 Units by mouth.   Yes [provider]  ciprofloxacin (CIPRO) 500 MG tablet Take 500 mg by mouth 2 (two) times daily. 7 day course starting on 07/09/2019 07/09/19  Yes [provider]  Coenzyme Q10 100 MG capsule Take 100 mg by mouth 3 (three) times daily.  Yes [provider]  divalproex (DEPAKOTE) 250 MG DR tablet Take 250 mg by mouth 2 (two) times daily.   Yes [provider]  donepezil (ARICEPT) 5 MG tablet Take 5 mg by mouth at bedtime.   Yes [provider]  furosemide (LASIX) 20 MG tablet Take 1 tablet by mouth daily as needed for fluid.  10/16/17  Yes [provider]  gabapentin (NEURONTIN) 100 MG capsule Take 100 mg by mouth at bedtime.   Yes [provider]  losartan (COZAAR) 100 MG tablet Take 100 mg by mouth daily.   Yes  [provider]  Multiple Vitamin (MULTIVITAMIN) tablet Take 1 tablet by mouth daily.   Yes [provider]  Multiple Vitamins-Minerals (EYE VITAMINS & MINERALS) TABS Take 1 tablet by mouth daily.   Yes [provider]  potassium chloride (K-DUR,KLOR-CON) 10 MEQ tablet Take 1 tablet by mouth daily as needed (for fluid retention when taking Lasix (Furosemide)).  10/16/17  Yes [provider]  risperiDONE (RISPERDAL) 1 MG tablet Take 1 mg by mouth at bedtime.    Yes [provider]  traZODone (DESYREL) 50 MG tablet Take 50 mg by mouth at bedtime.   Yes [provider]  Turmeric 500 MG CAPS Take 1 capsule by mouth daily.    Yes [provider]    Physical Exam: BP (!) 182/98   Pulse 76   Temp 98.3 F (36.8 C) (Oral)   Resp (!) 41   Ht 5\' 5"  (1.651 m)   SpO2 93%   BMI 29.45 kg/m   . General: 85 y.o. year-old female ill appearing but in no acute distress.   Marland Kitchen. HEENT: Normocephalic, atraumatic . Neck: Supple, trachea medial . Cardiovascular: Tachycardic.  Regular rate and rhythm with no rubs or gallops.  No thyromegaly or JVD noted.  No lower extremity edema. 2/4 pulses in all 4 extremities. Marland Kitchen. Respiratory: Tachypneic.  Decreased breath sounds in lower lobes bilaterally.   . Abdomen: Soft, nontender nondistended with normal bowel sounds x4 quadrants. . Muskuloskeletal: No cyanosis, clubbing or edema noted bilaterally . Neuro: CN II-XII intact, strength, sensation, reflexes . Skin: No ulcerative lesions noted or rashes . Psychiatry: Judgement and insight appear normal. Mood is appropriate for condition and setting          Labs on Admission:  Basic Metabolic Panel: Recent Labs  Lab 07/11/19 1908  NA 133*  K 4.1  CL 100  CO2 25  GLUCOSE 143*  BUN 21  CREATININE 1.03*  CALCIUM 8.2*   Liver Function Tests: Recent Labs  Lab 07/11/19 1908  AST 31  ALT 13  ALKPHOS 49  BILITOT 0.5  PROT 6.2*  ALBUMIN 2.9*   No  results for input(s): LIPASE, AMYLASE in the last 168 hours. No results for input(s): AMMONIA in the last 168 hours. CBC: Recent Labs  Lab 07/11/19 1908  WBC 2.6*  NEUTROABS 1.8  HGB 12.5  HCT 39.2  MCV 94.7  PLT 115*   Cardiac Enzymes: No results for input(s): CKTOTAL, CKMB, CKMBINDEX, TROPONINI in the last 168 hours.  BNP (last 3 results) No results for input(s): BNP in the last 8760 hours.  ProBNP (last 3 results) No results for input(s): PROBNP in the last 8760 hours.  CBG: No results for input(s): GLUCAP in the last 168 hours.  Radiological Exams on Admission: DG Chest Port 1 View  Result Date: 07/12/2019 CLINICAL DATA:  Intubation EXAM: PORTABLE CHEST 1 VIEW COMPARISON:  07/11/2019 FINDINGS: Endotracheal  tube tip is at the carina. The tip is very near the right mainstem bronchus. There is increased airspace opacity in the right lung. Orogastric tube tip and side port are below the field of view. IMPRESSION: 1. Endotracheal tube tip at the carina. Retraction by 3 cm would place it at the level of the clavicular heads. 2. Increased airspace opacity in the right lung, concerning for pneumonia. 3. Orogastric tube tip and side port below the field view. Electronically Signed   By: Ulyses Jarred M.D.   On: 07/12/2019 03:03   DG Chest Port 1 View  Result Date: 07/11/2019 CLINICAL DATA:  UTI and sepsis EXAM: PORTABLE CHEST 1 VIEW COMPARISON:  None. FINDINGS: Lungs are mildly hyperinflated. There is bibasilar scarring without focal consolidation. No pneumothorax or sizable pleural effusion. Mild cardiomegaly. Diffuse interstitial coarsening. IMPRESSION: COPD without acute airspace disease. Electronically Signed   By: Ulyses Jarred M.D.   On: 07/11/2019 19:22    EKG: I independently viewed the EKG done and my findings are as followed: Normal sinus rhythm at rate of 93bpm with prolonged PR interval.  Assessment/Plan Present on Admission: . Essential hypertension . COPD (chronic  obstructive pulmonary disease) (HCC)  Principal Problem:   COVID-19 virus infection Active Problems:   Essential hypertension   COPD (chronic obstructive pulmonary disease) (HCC)   Acute respiratory failure with hypoxia (HCC)   Altered mental status   Acute respiratory failure with hypoxia secondary to COVID-19 virus infection POC SARS coronavirus 2 Ag test positive. Continue albuterol Q6H Continue IV Decadron 6mg  daily Remdesivir per pharmacy protocol Continue vit. C 500 mg and Zn 220 mg daily Continue mucolytics and antitussives Continue Tylenol 6 weeks day as needed for fever Continue to obtain daily COVID inflammatory markers and adjust accordingly Continue supplemental oxygen via [place] to obtain SPO2 > 93%.  Patient initially had  O2Sat of 94% on supplemental oxygen at 2 LPM, but few hours after being in the ED, O2 sat decreased to 76%, she was placed on 10lpm HFNC without significant improvement in oxygenation and increased work of breathing, O2 sat was in low-mid 80s on NRB.  I spoke with daughter Annice Pih- [phone] POA) regarding plan of care and CODE STATUS, daughter states that patient is full code.  She was told that patient will require intubation  to be transferred to [facility] due to hypoxia on HFNC, the risks and complication in intubating a 85 year old patient with Covid 19 virus (including possible difficulty in being able to come off ventilation) was discussed with the daughter and she wants for patient to be intubated. Patient was intubated by ED physician, IV Tylenol was given due to persistent fever.  Procalcitonin was negative on admission, chest x-ray showed increased airspace opacity in the right lung concerning for pneumonia.  Patient received IV ceftriaxone and azithromycin on admission. Urinalysis will be done and procalcitonin will be repeated. Continue COVID-19 treatment as described above.  Patient will be transferred to [facility] ICU for further evaluation and  management.  SIRS with suspicion for sepsis Patient was tachycardic, tachypneic, leukopenic and febrile (meet SIRS criteria).  Chest x-ray s/p intubation showed  increased airspace opacity in the right lung concerning for pneumonia.  Chest x-ray on admission showed no acute airspace disease.  It is possible that patient could have aspirated. Patient will be empirically started on IV Rocephin and Clindamycin pending repeated procalcitonin  Leukopenia/ Thrombocytopenia/lymphopenia possibly secondary to above vs reactive Continue treatment as per above Continue to monitor CBC  with morning labs  Questionable altered mental status  Patient's baseline mental status unknown, she has underlying dementia since Aricept was noted in med rec Patient seen in his drugs will be held at this time  Essential hypertension Home meds will be held at this time due to soft BP  History of COPD  No bronchodilators noted in patient's med rec, we shall await updated med rec Continue management as described above  DVT prophylaxis: Lovenox  Code Status: Full code  Family Communication: Daughter Liborio Nixon [phone])  Disposition Plan: SNF pending clinical improvement  Consults called: None  Admission status: Inpatient    Frankey Shown MD [facility]  If 7PM-7AM, please contact night-coverage [email or web address]  07/12/2019, 3:09 AM

## 2019-07-11 NOTE — ED Notes (Signed)
Spoken with daughter Henrietta Dine) and notified her of pt positive covid results.

## 2019-07-11 NOTE — ED Provider Notes (Signed)
[facility]-EMERGENCY DEPT [facility] Emergency Department Provider Note MRN:  [id number]  Arrival date & time: 07/11/19     Chief Complaint   Altered Mental Status (recent uti)   History of Present Illness   Isabella Campbell is a 85 y.o. year-old female with a history of COPD, CAD presenting to the ED with chief complaint of AMS.  Report of recent UTI, report of altered mental status.  Febrile on arrival.  I was unable to obtain an accurate HPI, PMH, or ROS due to the patient's altered mental status.  Level 5 caveat.  Review of Systems  Positive for altered mental status.  Patient's Health History    Past Medical History:  Diagnosis Date  . Bipolar 1 disorder (HCC)   . COPD (chronic obstructive pulmonary disease) (HCC)    CT chest 11/2005 Dr. Orson Aloe  . Coronary artery disease    minimal bilateral disease  . Hypertension   . Pulmonary hypertension (HCC)     Past Surgical History:  Procedure Laterality Date  . CATARACT EXTRACTION Right   . CATARACT EXTRACTION W/PHACO Left 12/01/2017   Procedure: CATARACT EXTRACTION PHACO AND INTRAOCULAR LENS PLACEMENT (IOC);  Surgeon: Fabio Pierce, MD;  Location: [facility] ORS;  Service: Ophthalmology;  Laterality: Left;  CDE: 11.86  . DILATION AND CURETTAGE OF UTERUS    . hemrrhoidectomy      Family History  Problem Relation Age of Onset  . Cancer Other   . Coronary artery disease Other     Social History   Socioeconomic History  . Marital status: Widowed    Spouse name: Not on file  . Number of children: Not on file  . Years of education: Not on file  . Highest education level: Not on file  Occupational History  . Not on file  Tobacco Use  . Smoking status: Never Smoker  . Smokeless tobacco: Never Used  Substance and Sexual Activity  . Alcohol use: No  . Drug use: Never  . Sexual activity: Not on file  Other Topics Concern  . Not on file  Social History Narrative  . Not on file   Social Determinants of Health    Financial Resource Strain:   . Difficulty of Paying Living Expenses: Not on file  Food Insecurity:   . Worried About Programme researcher, broadcasting/film/video in the Last Year: Not on file  . Ran Out of Food in the Last Year: Not on file  Transportation Needs:   . Lack of Transportation (Medical): Not on file  . Lack of Transportation (Non-Medical): Not on file  Physical Activity:   . Days of Exercise per Week: Not on file  . Minutes of Exercise per Session: Not on file  Stress:   . Feeling of Stress : Not on file  Social Connections:   . Frequency of Communication with Friends and Family: Not on file  . Frequency of Social Gatherings with Friends and Family: Not on file  . Attends Religious Services: Not on file  . Active Member of Clubs or Organizations: Not on file  . Attends Banker Meetings: Not on file  . Marital Status: Not on file  Intimate Partner Violence:   . Fear of Current or Ex-Partner: Not on file  . Emotionally Abused: Not on file  . Physically Abused: Not on file  . Sexually Abused: Not on file     Physical Exam  Vital Signs and Nursing Notes reviewed Vitals:   07/11/19 1843 07/11/19 1900  BP:  Marland Kitchen)  175/84  Pulse:  92  Resp:  (!) 26  Temp:    SpO2: 95% 94%    CONSTITUTIONAL: Chronically ill-appearing, NAD NEURO: Alert, oriented to name only, moving all extremities EYES:  eyes equal and reactive ENT/NECK:  no LAD, no JVD CARDIO: Regular rate, well-perfused, normal S1 and S2 PULM: Decreased breath sounds bilateral bases, tachypneic GI/GU:  normal bowel sounds, non-distended, non-tender MSK/SPINE:  No gross deformities, no edema SKIN:  no rash, atraumatic PSYCH:  Appropriate speech and behavior  Diagnostic and Interventional Summary    EKG Interpretation  Date/Time:  Thursday July 11 2019 18:44:12 EST Ventricular Rate:  93 PR Interval:    QRS Duration: 81 QT Interval:  364 QTC Calculation: 453 R Axis:   61 Text Interpretation: Sinus rhythm  Prolonged PR interval No significant change was found Confirmed by Kennis CarinaBero, Kinisha Soper [phone]([id number]) on 07/11/2019 6:55:13 PM      Labs Reviewed  COMPREHENSIVE METABOLIC PANEL - Abnormal; Notable for the following components:      Result Value   Sodium 133 (*)    Glucose, Bld 143 (*)    Creatinine, Ser 1.03 (*)    Calcium 8.2 (*)    Total Protein 6.2 (*)    Albumin 2.9 (*)    GFR calc non Af Amer 45 (*)    GFR calc Af Amer 52 (*)    All other components within normal limits  CBC WITH DIFFERENTIAL/PLATELET - Abnormal; Notable for the following components:   WBC 2.6 (*)    Platelets 115 (*)    Lymphs Abs 0.5 (*)    All other components within normal limits  APTT - Abnormal; Notable for the following components:   aPTT 37 (*)    All other components within normal limits  POC SARS CORONAVIRUS 2 AG -  ED - Abnormal; Notable for the following components:   SARS Coronavirus 2 Ag POSITIVE (*)    All other components within normal limits  CULTURE, BLOOD (ROUTINE X 2)  CULTURE, BLOOD (ROUTINE X 2)  URINE CULTURE  LACTIC ACID, PLASMA  PROTIME-INR  LACTIC ACID, PLASMA  URINALYSIS, ROUTINE W REFLEX MICROSCOPIC  D-DIMER, QUANTITATIVE (NOT AT [facility])  PROCALCITONIN  LACTATE DEHYDROGENASE  FERRITIN  TRIGLYCERIDES  FIBRINOGEN  C-REACTIVE PROTEIN    DG Chest Port 1 View  Final Result      Medications  azithromycin (ZITHROMAX) 500 mg in sodium chloride 0.9 % 250 mL IVPB (500 mg Intravenous New Bag/Given 07/11/19 2026)  cefTRIAXone (ROCEPHIN) 2 g in sodium chloride 0.9 % 100 mL IVPB (0 g Intravenous Stopped 07/11/19 2025)     Procedures  /  Critical Care .Critical Care Performed by: Sabas SousBero, Chima Astorino M, MD Authorized by: Sabas SousBero, Deona Novitski M, MD   Critical care provider statement:    Critical care time (minutes):  34   Critical care was necessary to treat or prevent imminent or life-threatening deterioration of the following conditions: Concern for sepsis, COVID-19 with hypoxia.   Critical care was  time spent personally by me on the following activities:  Discussions with consultants, evaluation of patient's response to treatment, examination of patient, ordering and performing treatments and interventions, ordering and review of laboratory studies, ordering and review of radiographic studies, pulse oximetry, re-evaluation of patient's condition, obtaining history from patient or surrogate and review of old charts    ED Course and Medical Decision Making  I have reviewed the triage vital signs and the nursing notes.  Pertinent labs & imaging results that were available during  my care of the patient were reviewed by me and considered in my medical decision making (see below for details).     Fever, tachypnea, altered mental status, report of UTI, new oxygen requirement.  Code sepsis initiated, considering COVID-19, pneumonia, urosepsis.  I attempted to discuss goals of care with patient but she is altered and unable to make this decision given her lack of capacity.  Will attempt to contact family.  No answer from family.  Patient is Covid positive with new oxygen requirement, will admit to hospitalist service.  Barth Kirks. Sedonia Small, MD [place] [email or web address]  Final Clinical Impressions(s) / ED Diagnoses     ICD-10-CM   1. COVID-19  U07.1   2. Hypoxia  R09.02     ED Discharge Orders    None       Discharge Instructions Discussed with and Provided to Patient:   Discharge Instructions   None       Maudie Flakes, MD 07/11/19 2029

## 2019-07-12 ENCOUNTER — Inpatient Hospital Stay (HOSPITAL_COMMUNITY): Payer: Medicare Other

## 2019-07-12 DIAGNOSIS — J449 Chronic obstructive pulmonary disease, unspecified: Secondary | ICD-10-CM

## 2019-07-12 DIAGNOSIS — U071 COVID-19: Principal | ICD-10-CM

## 2019-07-12 DIAGNOSIS — L8990 Pressure ulcer of unspecified site, unspecified stage: Secondary | ICD-10-CM | POA: Diagnosis present

## 2019-07-12 DIAGNOSIS — J9601 Acute respiratory failure with hypoxia: Secondary | ICD-10-CM | POA: Diagnosis present

## 2019-07-12 DIAGNOSIS — R4182 Altered mental status, unspecified: Secondary | ICD-10-CM | POA: Diagnosis present

## 2019-07-12 LAB — BLOOD GAS, ARTERIAL
Acid-base deficit: 1.9 mmol/L (ref 0.0–2.0)
Bicarbonate: 22 mmol/L (ref 20.0–28.0)
Drawn by: 41977
FIO2: 100
MECHVT: 450 mL
O2 Saturation: 92.3 %
PEEP: 5 cmH2O
Patient temperature: 37
RATE: 18 resp/min
pCO2 arterial: 49.6 mmHg — ABNORMAL HIGH (ref 32.0–48.0)
pH, Arterial: 7.3 — ABNORMAL LOW (ref 7.350–7.450)
pO2, Arterial: 76.7 mmHg — ABNORMAL LOW (ref 83.0–108.0)

## 2019-07-12 LAB — MRSA PCR SCREENING: MRSA by PCR: NEGATIVE

## 2019-07-12 MED ORDER — CHLORHEXIDINE GLUCONATE CLOTH 2 % EX PADS
6.0000 | MEDICATED_PAD | Freq: Every day | CUTANEOUS | Status: DC
  Administered 2019-07-12 – 2019-07-22 (×11): 6 via TOPICAL

## 2019-07-12 MED ORDER — CHLORHEXIDINE GLUCONATE 0.12 % MT SOLN
15.0000 mL | Freq: Two times a day (BID) | OROMUCOSAL | Status: DC
  Administered 2019-07-12 – 2019-07-22 (×19): 15 mL via OROMUCOSAL
  Filled 2019-07-12 (×21): qty 15

## 2019-07-12 MED ORDER — SODIUM CHLORIDE 0.9 % IV BOLUS
1000.0000 mL | Freq: Once | INTRAVENOUS | Status: AC
  Administered 2019-07-12: 1000 mL via INTRAVENOUS

## 2019-07-12 MED ORDER — CLINDAMYCIN PHOSPHATE 600 MG/50ML IV SOLN
600.0000 mg | Freq: Three times a day (TID) | INTRAVENOUS | Status: DC
  Filled 2019-07-12 (×2): qty 50

## 2019-07-12 MED ORDER — DM-GUAIFENESIN ER 30-600 MG PO TB12
1.0000 | ORAL_TABLET | Freq: Two times a day (BID) | ORAL | Status: DC
  Administered 2019-07-13 – 2019-07-22 (×18): 1 via ORAL
  Filled 2019-07-12 (×23): qty 1

## 2019-07-12 MED ORDER — ACETAMINOPHEN 10 MG/ML IV SOLN
1000.0000 mg | Freq: Four times a day (QID) | INTRAVENOUS | Status: AC
  Administered 2019-07-12 – 2019-07-13 (×3): 1000 mg via INTRAVENOUS
  Filled 2019-07-12 (×4): qty 100

## 2019-07-12 MED ORDER — SODIUM CHLORIDE 0.9 % IV SOLN
200.0000 mg | Freq: Once | INTRAVENOUS | Status: AC
  Administered 2019-07-12: 200 mg via INTRAVENOUS
  Filled 2019-07-12: qty 40

## 2019-07-12 MED ORDER — ENOXAPARIN SODIUM 40 MG/0.4ML ~~LOC~~ SOLN
40.0000 mg | Freq: Two times a day (BID) | SUBCUTANEOUS | Status: DC
  Administered 2019-07-12 – 2019-07-22 (×20): 40 mg via SUBCUTANEOUS
  Filled 2019-07-12 (×20): qty 0.4

## 2019-07-12 MED ORDER — PROPOFOL 1000 MG/100ML IV EMUL
INTRAVENOUS | Status: AC
  Filled 2019-07-12: qty 100

## 2019-07-12 MED ORDER — FAMOTIDINE 40 MG/5ML PO SUSR
20.0000 mg | Freq: Two times a day (BID) | ORAL | Status: DC
  Administered 2019-07-12 – 2019-07-22 (×19): 20 mg via ORAL
  Filled 2019-07-12 (×20): qty 2.5

## 2019-07-12 MED ORDER — SODIUM CHLORIDE 0.9 % IV SOLN
100.0000 mg | Freq: Every day | INTRAVENOUS | Status: AC
  Administered 2019-07-13 – 2019-07-16 (×4): 100 mg via INTRAVENOUS
  Filled 2019-07-12 (×7): qty 20

## 2019-07-12 MED ORDER — PROPOFOL 1000 MG/100ML IV EMUL
5.0000 ug/kg/min | INTRAVENOUS | Status: DC
  Administered 2019-07-12: 5 ug/kg/min via INTRAVENOUS

## 2019-07-12 MED ORDER — SODIUM CHLORIDE 0.9 % IV SOLN
1.0000 g | INTRAVENOUS | Status: DC
  Administered 2019-07-12: 1 g via INTRAVENOUS
  Filled 2019-07-12: qty 10
  Filled 2019-07-12: qty 1

## 2019-07-12 MED ORDER — PHENYLEPHRINE HCL-NACL 10-0.9 MG/250ML-% IV SOLN
0.0000 ug/min | INTRAVENOUS | Status: DC
  Filled 2019-07-12: qty 250

## 2019-07-12 MED ORDER — FENTANYL 2500MCG IN NS 250ML (10MCG/ML) PREMIX INFUSION
0.0000 ug/h | INTRAVENOUS | Status: DC
  Administered 2019-07-12: 25 ug/h via INTRAVENOUS
  Filled 2019-07-12: qty 250

## 2019-07-12 MED ORDER — PHENYLEPHRINE HCL-NACL 10-0.9 MG/250ML-% IV SOLN
INTRAVENOUS | Status: AC
  Administered 2019-07-12: 25 ug/min via INTRAVENOUS
  Filled 2019-07-12: qty 250

## 2019-07-12 MED ORDER — ORAL CARE MOUTH RINSE
15.0000 mL | Freq: Two times a day (BID) | OROMUCOSAL | Status: DC
  Administered 2019-07-12 – 2019-07-22 (×21): 15 mL via OROMUCOSAL

## 2019-07-12 MED ORDER — ACETAMINOPHEN 650 MG RE SUPP
650.0000 mg | Freq: Once | RECTAL | Status: AC
  Administered 2019-07-12: 650 mg via RECTAL
  Filled 2019-07-12: qty 1

## 2019-07-12 MED ORDER — DEXMEDETOMIDINE HCL IN NACL 400 MCG/100ML IV SOLN
0.4000 ug/kg/h | INTRAVENOUS | Status: DC
  Administered 2019-07-12 (×2): 0.4 ug/kg/h via INTRAVENOUS
  Filled 2019-07-12: qty 100

## 2019-07-12 NOTE — Progress Notes (Signed)
NAME:  Isabella Campbell, MRN:  [id number], DOB:  [birth year], LOS: 1 ADMISSION DATE:  07/11/2019, CONSULTATION DATE:  12/25 REFERRING MD:  Pilar PlateBero, CHIEF COMPLAINT:  Dyspnea   Brief History   85 y/o female with COVID pneumonia, [facility], [facility]  History of present illness   85 y/o female admitted to [facility] pn 12/24 in the setting of presumed sepsis of a urinary source and COVID 19. Developed worsening hypoxemia over the next 8-10 hours and was intubated then transferred to our facility for further management.  She was intubated on my assessment and could not provide history.  Past Medical History  CAD Hypertension Pulmonary hypertension COPD > per notes mild airflow obstruction, no PFT available to review Bipolar disorder  Significant Hospital Events   12/24 admit [facility] 12/25 intubation for hypoxemia, [facility]  Consults:  PCCM  Procedures:  1225 ETT>   Significant Diagnostic Tests:    Micro Data:  12/24 blood >  12/24 urine >   Antimicrobials:  12/24 ceftriaxone >  12/24 azithromycin >  12/24 remdesivir >   Interim history/subjective:    On pressure support 10/5, has periods of normal respiratory rate, but predominantly tachypneic;   Objective   Blood pressure (!) 114/55, pulse 89, temperature 98.9 F (37.2 C), temperature source Oral, resp. rate (!) 23, height 5\' 5"  (1.651 m), weight 82.1 kg, SpO2 99 %.    Vent Mode: PRVC FiO2 (%):  [100 %] 100 % Set Rate:  [18 bmp-20 bmp] 20 bmp Vt Set:  [410 mL-460 mL] 450 mL PEEP:  [5 cmH20] 5 cmH20 Plateau Pressure:  [21 cmH20] 21 cmH20   Intake/Output Summary (Last 24 hours) at 07/12/2019 [id number] Last data filed at 07/12/2019 0458 Gross per 24 hour  Intake 1000 ml  Output --  Net 1000 ml   Filed Weights   07/12/19 0320  Weight: 82.1 kg    Examination:  General:  In bed on vent HENT: NCAT ETT in place PULM: CTA B, vent supported breathing CV: RRR, no mgr GI:  BS+, soft, nontender MSK: normal bulk and tone Neuro: sedated on vent  12/25 CXR images personally reviewed> diffuse bilateral air space disease, ett in place  Resolved Hospital Problem list     Assessment & Plan:  ARDS due to COVID 19 pneumonia> weaning on pressure support, anxiety limiting her ability to wean appropriately for us to gauge if she would be safe with extubation.  However with her advanced age it seems reasonable to attempt to liberate from mechanical ventilation ASAP.  Will discuss goals of care with her daughter. remdesivir Decadron Continue mechanical ventilation per ARDS protocol Target TVol 6-8cc/kgIBW Target Plateau Pressure < 30cm H20 Target driving pressure less than 15 cm of water Target PaO2 55-65: titrate PEEP/FiO2 per protocol As long as PaO2 to FiO2 ratio is less than 1:150 position in prone position for 16 hours a day Check CVP daily if CVL in place Target CVP less than 4, diurese as necessary Ventilator associated pneumonia prevention protocol  UTI sepsis> unclear, no U/A on record Ceftriaxone Check u/a  Need for sedation for mechanical ventilation Wean off propofol and fentanyl infusion Change to precedex RASS 0 to -1   Best practice:  Diet: start tube feeding Pain/Anxiety/Delirium protocol (if indicated): as above VAP protocol (if indicated): yes DVT prophylaxis: lovenox. Adjust to ICU/COVID dosing GI prophylaxis: famotidine Glucose control: SSI Mobility: bed rest Code Status: DNR> discussed with her  daughter this morning Family Communication: I spoke with her daughter at length with RT and RN with me. We want to get her off the vent ASAP, hopefully by tomorrow morning.  We changed code status to DNR, full medical support otherwise Disposition: remain in ICU  Labs   CBC: Recent Labs  Lab 07/11/19 1908  WBC 2.6*  NEUTROABS 1.8  HGB 12.5  HCT 39.2  MCV 94.7  PLT 115*    Basic Metabolic Panel: Recent Labs  Lab 07/11/19 1908   NA 133*  K 4.1  CL 100  CO2 25  GLUCOSE 143*  BUN 21  CREATININE 1.03*  CALCIUM 8.2*   GFR: Estimated Creatinine Clearance: 31.5 mL/min (A) (by C-G formula based on SCr of 1.03 mg/dL (H)). Recent Labs  Lab 07/11/19 1907 07/11/19 1908  PROCALCITON  --  <0.10  WBC  --  2.6*  LATICACIDVEN 1.9  --     Liver Function Tests: Recent Labs  Lab 07/11/19 1908  AST 31  ALT 13  ALKPHOS 49  BILITOT 0.5  PROT 6.2*  ALBUMIN 2.9*   No results for input(s): LIPASE, AMYLASE in the last 168 hours. No results for input(s): AMMONIA in the last 168 hours.  ABG    Component Value Date/Time   PHART 7.300 (L) 07/12/2019 0300   PCO2ART 49.6 (H) 07/12/2019 0300   PO2ART 76.7 (L) 07/12/2019 0300   HCO3 22.0 07/12/2019 0300   ACIDBASEDEF 1.9 07/12/2019 0300   O2SAT 92.3 07/12/2019 0300     Coagulation Profile: Recent Labs  Lab 07/11/19 1908  INR 1.1    Cardiac Enzymes: No results for input(s): CKTOTAL, CKMB, CKMBINDEX, TROPONINI in the last 168 hours.  HbA1C: No results found for: HGBA1C  CBG: No results for input(s): GLUCAP in the last 168 hours.  Review of Systems:   Cannot obtain, intubated  Past Medical History  She,  has a past medical history of Bipolar 1 disorder (HCC), COPD (chronic obstructive pulmonary disease) (HCC), Coronary artery disease, Hypertension, and Pulmonary hypertension (HCC).   Surgical History    Past Surgical History:  Procedure Laterality Date  . CATARACT EXTRACTION Right   . CATARACT EXTRACTION W/PHACO Left 12/01/2017   Procedure: CATARACT EXTRACTION PHACO AND INTRAOCULAR LENS PLACEMENT (IOC);  Surgeon: Fabio Pierce, MD;  Location: [facility];  Service: Ophthalmology;  Laterality: Left;  CDE: 11.86  . DILATION AND CURETTAGE OF UTERUS    . hemrrhoidectomy       Social History   reports that she has never smoked. She has never used smokeless tobacco. She reports that she does not drink alcohol or use drugs.   Family History   Her family  history includes Cancer in an other family member; Coronary artery disease in an other family member.   Allergies Allergies  Allergen Reactions  . Doxycycline Hives  . Penicillins Hives  . Quetiapine     REACTION: involuntary licking of tongue against teeth, felt really bad on med  . Sulfonamide Derivatives Nausea And Vomiting     Home Medications  Prior to Admission medications   Medication Sig Start Date End Date Taking? Authorizing Provider  acetaminophen (TYLENOL) 325 MG tablet Take 650 mg by mouth every 6 (six) hours as needed.   Yes [provider]  Ascorbic Acid (VITAMIN C) 1000 MG tablet Take 1,000 mg by mouth daily.   Yes [provider]  atenolol (TENORMIN) 25 MG tablet Take 25 mg by mouth daily.    Yes [provider]  benztropine (COGENTIN) 0.5 MG tablet Take 0.5 mg by mouth daily.    Yes [provider]  Cholecalciferol 1000 units tablet Take 1,000 Units by mouth.   Yes [provider]  ciprofloxacin (CIPRO) 500 MG tablet Take 500 mg by mouth 2 (two) times daily. 7 day course starting on 07/09/2019 07/09/19  Yes [provider]  Coenzyme Q10 100 MG capsule Take 100 mg by mouth 3 (three) times daily.   Yes [provider]  divalproex (DEPAKOTE) 250 MG DR tablet Take 250 mg by mouth 2 (two) times daily.   Yes [provider]  donepezil (ARICEPT) 5 MG tablet Take 5 mg by mouth at bedtime.   Yes [provider]  furosemide (LASIX) 20 MG tablet Take 1 tablet by mouth daily as needed for fluid.  10/16/17  Yes [provider]  gabapentin (NEURONTIN) 100 MG capsule Take 100 mg by mouth at bedtime.   Yes [provider]  losartan (COZAAR) 100 MG tablet Take 100 mg by mouth daily.   Yes [provider]  Multiple Vitamin (MULTIVITAMIN) tablet Take 1 tablet by mouth daily.   Yes [provider]  Multiple Vitamins-Minerals (EYE VITAMINS & MINERALS) TABS Take 1 tablet by mouth  daily.   Yes [provider]  potassium chloride (K-DUR,KLOR-CON) 10 MEQ tablet Take 1 tablet by mouth daily as needed (for fluid retention when taking Lasix (Furosemide)).  10/16/17  Yes [provider]  risperiDONE (RISPERDAL) 1 MG tablet Take 1 mg by mouth at bedtime.    Yes [provider]  traZODone (DESYREL) 50 MG tablet Take 50 mg by mouth at bedtime.   Yes [provider]  Turmeric 500 MG CAPS Take 1 capsule by mouth daily.    Yes [provider]     Critical care time: 45 minutes     Roselie Awkward, MD [place] PCCM Pager: [phone] Cell: [phone] If no response, call [phone]

## 2019-07-12 NOTE — ED Provider Notes (Addendum)
2:05 AM-patient's nurse was contacted by hospitalist with request to intubate her, due to progressive tachypnea, and hypoxia.  I asked the nurse to obtain an RSI kit prepared for intubation.  Patient presented for evaluation around 842 last night, with fever.  Covid antigen was positive.  Reviewed patient's current vital signs, and laboratory results.  Discussed with hospitalist, directly on the phone, he asserts that he has talked to the daughter who is her POA and desires intubation.  We will proceed with intubation.  Procedure Name: Intubation Date/Time: 07/12/2019 2:38 AM Performed by: Daleen Bo, MD Pre-anesthesia Checklist: Patient identified, Patient being monitored, Emergency Drugs available, Timeout performed and Suction available Oxygen Delivery Method: Non-rebreather mask Preoxygenation: Pre-oxygenation with 100% oxygen Induction Type: Rapid sequence Grade View: Grade IV Tube size: 7.5 mm Airway Equipment and Method: Video-laryngoscopy Placement Confirmation: ETT inserted through vocal cords under direct vision,  CO2 detector and Positive ETCO2 Secured at: 22 cm Tube secured with: ETT holder Dental Injury: Teeth and Oropharynx as per pre-operative assessment      2:40 AM-informed hospitalist that patient has been intubated and dipper Van drip has been started.  He requested initiation of Decadron and remdesivir.  These have already been ordered.     Daleen Bo, MD 07/12/19 [phone]  Post intervention chest x-ray reviewed by me, appears to be that the ET tube is at the carina, patient currently saturating greater than 90%, this was confirmed by radiology at 310.  We will ask respiratory therapy to withdrawal tube 3 cm.    Daleen Bo, MD 07/12/19 [phone]

## 2019-07-12 NOTE — ED Notes (Signed)
Pt oxygen 73% on 2L [place]. Oxygen increased to 5L  Oxygen sat 78% hospitalist paged. Order given to to start high flow .

## 2019-07-12 NOTE — Sepsis Progress Note (Signed)
Notified bedside nurse of need to administer fluid bolus verify with RN about fluid bolus, will verify with MD. Pt  LA is normal and pt BP stable.Marland Kitchen

## 2019-07-12 NOTE — Progress Notes (Signed)
Ms. Lillibridge was stabilized this am with precedex and then neo to hold her blood pressure. Dr. Lake Bells spoke to [place], Ms. Liptak's daughter, and the decision for DNR was made. The plan for extubation is set for the am of 07/13/2019 as Dr. Lake Bells wanted to see Ms. Carlyon's response to the vent and respiratory control. Ms Myrick initially was anxious, and she pulled out her OGT, which Sallye Ober NP stated to hold and not replace. Ms. Lezotte was able to settled down after the precedex was started. Eventually the precedex was held due to her blood pressure dropping and her heart rate slowing down. I spoke to Weinert this afternoon and she stated that her mom has a slow heart rate and that it is not unusual for her heart to beat at 45bpm. Ms. Oberman is tolerating the ventilator, her vital signs are stable, and she is able to respond to the nurse by opening her eyes, and following commands. She is aware that the ET tube will be removed tomorrow and is taking in rest periods in between the nurses assessments.

## 2019-07-12 NOTE — Progress Notes (Signed)
Spoke with and updated pts daughter, Thayer Headings, over the phone. Daughter states at baseline pt is A&O and able to perform ADLs on her own.

## 2019-07-12 NOTE — Progress Notes (Signed)
Pt transported to room 203-1 without incident. Tele and MD notified of arrival. pts belongings include: silver/ yellow watch, glasses, key on blue lanyard and grey tennis shoes.

## 2019-07-12 NOTE — Progress Notes (Signed)
eLink Physician-Brief Progress Note Patient Name: Wendell Fiebig Mceachin DOB: 04/07/1918 MRN: [id number]   Date of Service  07/12/2019  HPI/Events of Note  [facility] transfer, intubated for COVID pna  eICU Interventions  Vent orders On low dose prop/ fent Withdraw ETT by 3 cm if not done already     Intervention Category Evaluation Type: New Patient Evaluation  Glynna Failla V. Raelle Chambers 07/12/2019, 6:15 AM

## 2019-07-13 DIAGNOSIS — I10 Essential (primary) hypertension: Secondary | ICD-10-CM

## 2019-07-13 LAB — CBC WITH DIFFERENTIAL/PLATELET
Abs Immature Granulocytes: 0.1 10*3/uL — ABNORMAL HIGH (ref 0.00–0.07)
Basophils Absolute: 0 10*3/uL (ref 0.0–0.1)
Basophils Relative: 0 %
Eosinophils Absolute: 0 10*3/uL (ref 0.0–0.5)
Eosinophils Relative: 0 %
HCT: 41.4 % (ref 36.0–46.0)
Hemoglobin: 13.4 g/dL (ref 12.0–15.0)
Immature Granulocytes: 2 %
Lymphocytes Relative: 11 %
Lymphs Abs: 0.6 10*3/uL — ABNORMAL LOW (ref 0.7–4.0)
MCH: 30 pg (ref 26.0–34.0)
MCHC: 32.4 g/dL (ref 30.0–36.0)
MCV: 92.8 fL (ref 80.0–100.0)
Monocytes Absolute: 0.4 10*3/uL (ref 0.1–1.0)
Monocytes Relative: 8 %
Neutro Abs: 3.9 10*3/uL (ref 1.7–7.7)
Neutrophils Relative %: 79 %
Platelets: 157 10*3/uL (ref 150–400)
RBC: 4.46 MIL/uL (ref 3.87–5.11)
RDW: 13 % (ref 11.5–15.5)
WBC: 5 10*3/uL (ref 4.0–10.5)
nRBC: 0 % (ref 0.0–0.2)

## 2019-07-13 LAB — C-REACTIVE PROTEIN: CRP: 7.3 mg/dL — ABNORMAL HIGH (ref <1.0)

## 2019-07-13 LAB — COMPREHENSIVE METABOLIC PANEL
ALT: 13 U/L (ref 0–44)
AST: 37 U/L (ref 15–41)
Albumin: 2.4 g/dL — ABNORMAL LOW (ref 3.5–5.0)
Alkaline Phosphatase: 45 U/L (ref 38–126)
Anion gap: 11 (ref 5–15)
BUN: 27 mg/dL — ABNORMAL HIGH (ref 8–23)
CO2: 25 mmol/L (ref 22–32)
Calcium: 8 mg/dL — ABNORMAL LOW (ref 8.9–10.3)
Chloride: 103 mmol/L (ref 98–111)
Creatinine, Ser: 1.05 mg/dL — ABNORMAL HIGH (ref 0.44–1.00)
GFR calc Af Amer: 51 mL/min — ABNORMAL LOW (ref >60)
GFR calc non Af Amer: 44 mL/min — ABNORMAL LOW (ref >60)
Glucose, Bld: 109 mg/dL — ABNORMAL HIGH (ref 70–99)
Potassium: 4.8 mmol/L (ref 3.5–5.1)
Sodium: 139 mmol/L (ref 135–145)
Total Bilirubin: 0.2 mg/dL — ABNORMAL LOW (ref 0.3–1.2)
Total Protein: 5.5 g/dL — ABNORMAL LOW (ref 6.5–8.1)

## 2019-07-13 LAB — MAGNESIUM: Magnesium: 1.6 mg/dL — ABNORMAL LOW (ref 1.7–2.4)

## 2019-07-13 LAB — PROCALCITONIN: Procalcitonin: 1.33 ng/mL

## 2019-07-13 LAB — PHOSPHORUS: Phosphorus: 4.1 mg/dL (ref 2.5–4.6)

## 2019-07-13 LAB — D-DIMER, QUANTITATIVE: D-Dimer, Quant: 1.35 ug/mL-FEU — ABNORMAL HIGH (ref 0.00–0.50)

## 2019-07-13 LAB — FERRITIN: Ferritin: 347 ng/mL — ABNORMAL HIGH (ref 11–307)

## 2019-07-13 MED ORDER — FUROSEMIDE 10 MG/ML IJ SOLN
40.0000 mg | Freq: Four times a day (QID) | INTRAMUSCULAR | Status: AC
  Administered 2019-07-13 – 2019-07-14 (×2): 40 mg via INTRAVENOUS
  Filled 2019-07-13 (×2): qty 4

## 2019-07-13 MED ORDER — TRAZODONE HCL 50 MG PO TABS: 50.0000 mg | ORAL_TABLET | Freq: Every evening | ORAL | Status: DC | PRN

## 2019-07-13 NOTE — Progress Notes (Signed)
Extubation Procedure Note  Patient Details:   Name: Isabella Campbell DOB: 1917/11/11 MRN: [id number]   Airway Documentation:    Vent end date: 07/13/19 Vent end time: 1440   Evaluation  O2 sats: stable throughout Complications: No apparent complications Patient did tolerate procedure well. Bilateral Breath Sounds: Diminished   Yes   Pt extubated to 8L HFNC per MD order. Positive cuff leak noted prior to extubation. Pt able to speak and has a strong congested cough post extubation. VS within normal limits. RT will wean O2 as tolerated  Vella, [place] 07/13/2019, 5:51 PM

## 2019-07-13 NOTE — Progress Notes (Signed)
NAME:  EDMUND RICK, MRN:  [id number], DOB:  11-01-1917, LOS: 2 ADMISSION DATE:  07/11/2019, CONSULTATION DATE:  12/25 REFERRING MD:  Sedonia Small, CHIEF COMPLAINT:  Dyspnea   Brief History   85 y/o female with COVID pneumonia, intubated at [facility] ED, sent to [facility] on 12/25.  History of present illness   85 y/o female admitted to [facility] 12/24 in the setting of presumed sepsis of a urinary source and COVID 19. Developed worsening hypoxemia over the next 8-10 hours and was intubated then transferred to our facility for further management.  She was intubated on my assessment and could not provide history.  Past Medical History  CAD Hypertension Pulmonary hypertension COPD > per notes mild airflow obstruction, no PFT available to review Bipolar disorder  Significant Hospital Events   12/24 admit [facility] 12/25 intubation for hypoxemia, transfer to [facility] for further management  Consults:  PCCM  Procedures:  4098 ETT>   Significant Diagnostic Tests:    Micro Data:  12/24 blood >  12/24 urine >   Antimicrobials:  12/24 ceftriaxone >  12/24 azithromycin >  12/24 remdesivir >   Interim history/subjective:    Resting comfortably on pressure support ventilation this morning  Objective   Blood pressure (!) 173/88, pulse 84, temperature 98 F (36.7 C), temperature source Axillary, resp. rate (!) 24, height 5\' 5"  (1.651 m), weight 82.1 kg, SpO2 97 %.    Vent Mode: PRVC FiO2 (%):  [50 %] 50 % Set Rate:  [20 bmp] 20 bmp Vt Set:  [450 mL] 450 mL PEEP:  [5 cmH20] 5 cmH20 Plateau Pressure:  [18 [id number] cmH20] 18 cmH20   Intake/Output Summary (Last 24 hours) at 07/13/2019 1409 Last data filed at 07/13/2019 1000 Gross per 24 hour  Intake 688.51 ml  Output 890 ml  Net -201.49 ml   Filed Weights   07/12/19 0320  Weight: 82.1 kg    Examination:  General:  In bed on vent HENT: NCAT ETT in place PULM:  vent supported breathing CV: extremities well perfused GI: BS+, soft,  nontender MSK: normal bulk and tone Neuro: awake on vent  12/25 CXR images personally reviewed> diffuse bilateral air space disease, ett in place  Resolved Hospital Problem list     Assessment & Plan:  ARDS due to COVID 19 pneumonia> weaning well 12/26 Extubate today Continue remdesivir Continue decadron Continue supplemental O2 to maintain O2 saturation> 88%  UTI sepsis> Need to send U/a Continue ceftriaxone for now  Need for sedation for mechanical ventilation D/c sedation protocol  Goals of care: one way extubation, full medical support, DNR if arrests, do not re-intubate  Best practice:  Diet: start tube feeding Pain/Anxiety/Delirium protocol (if indicated): as above VAP protocol (if indicated): yes DVT prophylaxis: lovenox. Adjust to ICU/COVID dosing GI prophylaxis: famotidine Glucose control: SSI Mobility: bed rest Code Status: DNR Family Communication: Spoke to daughter today, confirmed one way extubation Disposition: remain in ICU  Labs   CBC: Recent Labs  Lab 07/11/19 1908 07/13/19 0200  WBC 2.6* 5.0  NEUTROABS 1.8 3.9  HGB 12.5 13.4  HCT 39.2 41.4  MCV 94.7 92.8  PLT 115* 295    Basic Metabolic Panel: Recent Labs  Lab 07/11/19 1908 07/13/19 0200  NA 133* 139  K 4.1 4.8  CL 100 103  CO2 25 25  GLUCOSE 143* 109*  BUN 21 27*  CREATININE 1.03* 1.05*  CALCIUM 8.2* 8.0*  MG  --  1.6*  PHOS  --  4.1  GFR: Estimated Creatinine Clearance: 30.9 mL/min (A) (by C-G formula based on SCr of 1.05 mg/dL (H)). Recent Labs  Lab 07/11/19 1907 07/11/19 1908 07/13/19 0200  PROCALCITON  --  <0.10 1.33  WBC  --  2.6* 5.0  LATICACIDVEN 1.9  --   --     Liver Function Tests: Recent Labs  Lab 07/11/19 1908 07/13/19 0200  AST 31 37  ALT 13 13  ALKPHOS 49 45  BILITOT 0.5 0.2*  PROT 6.2* 5.5*  ALBUMIN 2.9* 2.4*   No results for input(s): LIPASE, AMYLASE in the last 168 hours. No results for input(s): AMMONIA in the last 168 hours.  ABG     Component Value Date/Time   PHART 7.300 (L) 07/12/2019 0300   PCO2ART 49.6 (H) 07/12/2019 0300   PO2ART 76.7 (L) 07/12/2019 0300   HCO3 22.0 07/12/2019 0300   ACIDBASEDEF 1.9 07/12/2019 0300   O2SAT 92.3 07/12/2019 0300     Coagulation Profile: Recent Labs  Lab 07/11/19 1908  INR 1.1    Cardiac Enzymes: No results for input(s): CKTOTAL, CKMB, CKMBINDEX, TROPONINI in the last 168 hours.  HbA1C: No results found for: HGBA1C  CBG: No results for input(s): GLUCAP in the last 168 hours.  Review of Systems:   Cannot obtain, intubated  Past Medical History  She,  has a past medical history of Bipolar 1 disorder (HCC), COPD (chronic obstructive pulmonary disease) (HCC), Coronary artery disease, Hypertension, and Pulmonary hypertension (HCC).   Surgical History    Past Surgical History:  Procedure Laterality Date  . CATARACT EXTRACTION Right   . CATARACT EXTRACTION W/PHACO Left 12/01/2017   Procedure: CATARACT EXTRACTION PHACO AND INTRAOCULAR LENS PLACEMENT (IOC);  Surgeon: Fabio PierceWrzosek, James, MD;  Location: [facility];  Service: Ophthalmology;  Laterality: Left;  CDE: 11.86  . DILATION AND CURETTAGE OF UTERUS    . hemrrhoidectomy       Social History   reports that she has never smoked. She has never used smokeless tobacco. She reports that she does not drink alcohol or use drugs.   Family History   Her family history includes Cancer in an other family member; Coronary artery disease in an other family member.   Allergies Allergies  Allergen Reactions  . Doxycycline Hives  . Penicillins Hives  . Quetiapine     REACTION: involuntary licking of tongue against teeth, felt really bad on med  . Sulfonamide Derivatives Nausea And Vomiting     Home Medications  Prior to Admission medications   Medication Sig Start Date End Date Taking? Authorizing Provider  acetaminophen (TYLENOL) 325 MG tablet Take 650 mg by mouth every 6 (six) hours as needed.   Yes [provider]   Ascorbic Acid (VITAMIN C) 1000 MG tablet Take 1,000 mg by mouth daily.   Yes [provider]  atenolol (TENORMIN) 25 MG tablet Take 25 mg by mouth daily.    Yes [provider]  benztropine (COGENTIN) 0.5 MG tablet Take 0.5 mg by mouth daily.    Yes [provider]  Cholecalciferol 1000 units tablet Take 1,000 Units by mouth.   Yes [provider]  ciprofloxacin (CIPRO) 500 MG tablet Take 500 mg by mouth 2 (two) times daily. 7 day course starting on 07/09/2019 07/09/19  Yes [provider]  Coenzyme Q10 100 MG capsule Take 100 mg by mouth 3 (three) times daily.   Yes [provider]  divalproex (DEPAKOTE) 250 MG DR tablet Take 250 mg by mouth 2 (two) times  daily.   Yes [provider]  donepezil (ARICEPT) 5 MG tablet Take 5 mg by mouth at bedtime.   Yes [provider]  furosemide (LASIX) 20 MG tablet Take 1 tablet by mouth daily as needed for fluid.  10/16/17  Yes [provider]  gabapentin (NEURONTIN) 100 MG capsule Take 100 mg by mouth at bedtime.   Yes [provider]  losartan (COZAAR) 100 MG tablet Take 100 mg by mouth daily.   Yes [provider]  Multiple Vitamin (MULTIVITAMIN) tablet Take 1 tablet by mouth daily.   Yes [provider]  Multiple Vitamins-Minerals (EYE VITAMINS & MINERALS) TABS Take 1 tablet by mouth daily.   Yes [provider]  potassium chloride (K-DUR,KLOR-CON) 10 MEQ tablet Take 1 tablet by mouth daily as needed (for fluid retention when taking Lasix (Furosemide)).  10/16/17  Yes [provider]  risperiDONE (RISPERDAL) 1 MG tablet Take 1 mg by mouth at bedtime.    Yes [provider]  traZODone (DESYREL) 50 MG tablet Take 50 mg by mouth at bedtime.   Yes [provider]  Turmeric 500 MG CAPS Take 1 capsule by mouth daily.    Yes [provider]     Critical care time: 35 minutes     Heber Paauilo, MD [place]  PCCM Pager: [phone] Cell: [phone] If no response, call [phone]

## 2019-07-13 NOTE — Progress Notes (Signed)
SLP Cancellation Note  Patient Details Name: Isabella Campbell MRN: [id number] DOB: 1918/01/11   Cancelled treatment:       Reason Eval/Treat Not Completed: Patient not medically ready. Pt just extubated about an hour ago per RN. We reviewed the [place] swallow protocol. RN will attempt after pt has had more time to recover following extubation. Will f/u as needed.    Deny Chevez, Katherene Ponto 07/13/2019, 5:20 PM

## 2019-07-14 DIAGNOSIS — J431 Panlobular emphysema: Secondary | ICD-10-CM

## 2019-07-14 DIAGNOSIS — J1289 Other viral pneumonia: Secondary | ICD-10-CM

## 2019-07-14 DIAGNOSIS — R400 Somnolence: Secondary | ICD-10-CM

## 2019-07-14 LAB — GLUCOSE, CAPILLARY: Glucose-Capillary: 131 mg/dL — ABNORMAL HIGH (ref 70–99)

## 2019-07-14 LAB — CBC WITH DIFFERENTIAL/PLATELET
Abs Immature Granulocytes: 0.15 10*3/uL — ABNORMAL HIGH (ref 0.00–0.07)
Basophils Absolute: 0.1 10*3/uL (ref 0.0–0.1)
Basophils Relative: 1 %
Eosinophils Absolute: 0 10*3/uL (ref 0.0–0.5)
Eosinophils Relative: 0 %
HCT: 44 % (ref 36.0–46.0)
Hemoglobin: 14.7 g/dL (ref 12.0–15.0)
Immature Granulocytes: 2 %
Lymphocytes Relative: 7 %
Lymphs Abs: 0.7 10*3/uL (ref 0.7–4.0)
MCH: 30.1 pg (ref 26.0–34.0)
MCHC: 33.4 g/dL (ref 30.0–36.0)
MCV: 90.2 fL (ref 80.0–100.0)
Monocytes Absolute: 0.7 10*3/uL (ref 0.1–1.0)
Monocytes Relative: 8 %
Neutro Abs: 7.6 10*3/uL (ref 1.7–7.7)
Neutrophils Relative %: 82 %
Platelets: 264 10*3/uL (ref 150–400)
RBC: 4.88 MIL/uL (ref 3.87–5.11)
RDW: 13.2 % (ref 11.5–15.5)
WBC: 9.2 10*3/uL (ref 4.0–10.5)
nRBC: 0 % (ref 0.0–0.2)

## 2019-07-14 LAB — COMPREHENSIVE METABOLIC PANEL
ALT: 17 U/L (ref 0–44)
AST: 44 U/L — ABNORMAL HIGH (ref 15–41)
Albumin: 2.6 g/dL — ABNORMAL LOW (ref 3.5–5.0)
Alkaline Phosphatase: 49 U/L (ref 38–126)
Anion gap: 10 (ref 5–15)
BUN: 31 mg/dL — ABNORMAL HIGH (ref 8–23)
CO2: 25 mmol/L (ref 22–32)
Calcium: 8.3 mg/dL — ABNORMAL LOW (ref 8.9–10.3)
Chloride: 104 mmol/L (ref 98–111)
Creatinine, Ser: 1.05 mg/dL — ABNORMAL HIGH (ref 0.44–1.00)
GFR calc Af Amer: 51 mL/min — ABNORMAL LOW (ref >60)
GFR calc non Af Amer: 44 mL/min — ABNORMAL LOW (ref >60)
Glucose, Bld: 149 mg/dL — ABNORMAL HIGH (ref 70–99)
Potassium: 4 mmol/L (ref 3.5–5.1)
Sodium: 139 mmol/L (ref 135–145)
Total Bilirubin: 0.5 mg/dL (ref 0.3–1.2)
Total Protein: 5.9 g/dL — ABNORMAL LOW (ref 6.5–8.1)

## 2019-07-14 LAB — C-REACTIVE PROTEIN: CRP: 6.8 mg/dL — ABNORMAL HIGH (ref <1.0)

## 2019-07-14 LAB — FERRITIN: Ferritin: 377 ng/mL — ABNORMAL HIGH (ref 11–307)

## 2019-07-14 LAB — PHOSPHORUS: Phosphorus: 3.8 mg/dL (ref 2.5–4.6)

## 2019-07-14 LAB — PROCALCITONIN: Procalcitonin: 0.96 ng/mL

## 2019-07-14 LAB — D-DIMER, QUANTITATIVE: D-Dimer, Quant: 1.01 ug/mL-FEU — ABNORMAL HIGH (ref 0.00–0.50)

## 2019-07-14 LAB — MAGNESIUM: Magnesium: 1.6 mg/dL — ABNORMAL LOW (ref 1.7–2.4)

## 2019-07-14 MED ORDER — HYDRALAZINE HCL 20 MG/ML IJ SOLN: 5.0000 mg | Freq: Four times a day (QID) | INTRAMUSCULAR | Status: DC | PRN

## 2019-07-14 MED ORDER — MAGNESIUM SULFATE 2 GM/50ML IV SOLN
2.0000 g | Freq: Once | INTRAVENOUS | Status: AC
  Administered 2019-07-14: 2 g via INTRAVENOUS
  Filled 2019-07-14: qty 50

## 2019-07-14 MED ORDER — METOPROLOL TARTRATE 5 MG/5ML IV SOLN
2.5000 mg | INTRAVENOUS | Status: DC
  Administered 2019-07-14 – 2019-07-15 (×5): 2.5 mg via INTRAVENOUS
  Filled 2019-07-14 (×4): qty 5

## 2019-07-14 MED ORDER — IPRATROPIUM-ALBUTEROL 20-100 MCG/ACT IN AERS
1.0000 | INHALATION_SPRAY | Freq: Four times a day (QID) | RESPIRATORY_TRACT | Status: DC
  Administered 2019-07-14 – 2019-07-22 (×32): 1 via RESPIRATORY_TRACT
  Filled 2019-07-14: qty 4

## 2019-07-14 NOTE — Progress Notes (Signed)
[facility] PCCM  Doing well post extubation PCCM available prn  Roselie Awkward, MD [place] PCCM Pager: [phone] Cell: [phone] If no response, call [phone]

## 2019-07-14 NOTE — Progress Notes (Signed)
Spoke with patient';s daughter Marcie Bal two times today and gave update and answered all questions.

## 2019-07-14 NOTE — Progress Notes (Signed)
  Speech Language Pathology Patient Details Name: Isabella Campbell MRN: [id number] DOB: [birth year] Today's Date: 07/14/2019 Time:  -     Checked on pt via chart review and spoke with nurse who reported pt appeared to swallow the pears/solids without observable difficulty. She does cough with thin liquids but Ensure, which is slightly thicker, did not result if s/s aspiration. SLP discussed with RN to ensure she is in upright position with meals, allow Ensure (not thin) until tomorrow when SLP will assess (plans for in the am).                 GO                Houston Siren 07/14/2019, 5:59 PM   [name] Risk analyst [phone] Office [phone]

## 2019-07-14 NOTE — Progress Notes (Signed)
PROGRESS NOTE    Isabella Campbell  WFU:[id number] DOB: 1917/11/15 DOA: 07/11/2019 PCP: Glenda Chroman, MD   Brief Narrative:  85 y.o. WF from [facility] PMHx bipolar 1 disorder, essential HTN, pulmonary HTN, CAD, COPD,  Sent to the [facility], ED due to altered mental status.  Baseline mental status unknown at this time due to patient being unable to provide history.  History was obtained from ED physician and ED chart.  Per report, patient was recently diagnosed with UTI and was started on Keflex.  ED Course: In the ED emergency room, patient was noted to be febrile with a temperature of 100.5, she was tachycardic and tachypneic with a BP of 175/84, O2 sats was 94% on supplemental oxygen via [place] at 2 LPM.  Work-up in the ED showed leukopenia, thrombocytopenia, hypoalbuminemia.  CRP was 4.9, D-dimer 1.46, fibrinogen 516, LDH 260, procalcitonin was negative, ferritin 230. POC SARS coronavirus 2 Ag test positive.  Chest x-ray shows COPD without acute airspace disease.  She was initially treated with IV ceftriaxone due to presumed CAP.  Hospitalist was asked to admit patient for further evaluation and management.   Subjective: A/O x4, negative CP, negative abdominal pain.  Hard of hearing.  Last 24 hours afebrile.  Nursing reports aspiration with consumption of fluids.   Assessment & Plan:   Principal Problem:   COVID-19 virus infection Active Problems:   Essential hypertension   COPD (chronic obstructive pulmonary disease) (HCC)   Acute respiratory failure with hypoxia (HCC)   Altered mental status   Pressure injury of skin  Covid pneumonia/acute respiratory failure with hypoxia COVID-19 Labs  Recent Labs    07/11/19 1908 07/13/19 0200 07/14/19 0453  DDIMER 1.46* 1.35* 1.01*  FERRITIN 230 347*  --   LDH 260*  --   --   CRP 4.9* 7.3*  --     12/24 POC SARS coronavirus positive -Decadron 6 mg daily -Remdesivir per pharmacy protocol -Combivent QID -Vitamins  per Covid protocol -Titrate O2 to maintain SPO2> 88% -Prone patient 16 hours/day; if cannot tolerate 2 to 3 hours per shift  Sepsis/aspiration pneumonia -Patient initially met criteria for SIRS, tachycardic, tachypneic, leukopenic and febrile. -CXR s/p intubation showed increased airspace opacity in the right lung concerning for aspiration pneumonia -Initially started on empiric antibiotics.  Have been discontinued.   -Continue to trend procalcitonin restart if procalcitonin trends up, leukocytosis, fever.  Leukopenia/ Thrombocytopenia/lymphopenia possibly secondary to above vs reactive -Resolved  Questionable altered mental status  -Resolved patient A/O x4 able to answer all questions follows all commands  Essential hypertension -Metoprolol IV 2.5 mg q 4hr until patient passes swallow study -Hydralazine PRN  History of COPD  -See Covid pneumonia  Dysphagia -N.p.o. until swallow evaluation -12/27 swallow eval pending  Hypomagnesmia -Magnesium goal> 2 -Magnesium IV 2 g   DVT prophylaxis: Lovenox Code Status: DNR Family Communication:  Disposition Plan:    Consultants:  PCCM  Procedures/Significant Events:     I have personally reviewed and interpreted all radiology studies and my findings are as above.  VENTILATOR SETTINGS: HFNC 12/27 Flow rate; 8 L/min SPO2 92%    Cultures   Antimicrobials: Anti-infectives (From admission, onward)   Start     Dose/Rate Stop   07/13/19 1000  remdesivir 100 mg in sodium chloride 0.9 % 100 mL IVPB     100 mg 200 mL/hr over 30 Minutes 07/17/19 0959   07/12/19 2000  cefTRIAXone (ROCEPHIN) 1 g in sodium chloride 0.9 %  100 mL IVPB  Status:  Discontinued     1 g 200 mL/hr over 30 Minutes 07/13/19 1423   07/12/19 0600  clindamycin (CLEOCIN) IVPB 600 mg  Status:  Discontinued     600 mg 100 mL/hr over 30 Minutes 07/12/19 0930   07/12/19 0230  remdesivir 200 mg in sodium chloride 0.9% 250 mL IVPB     200 mg 580 mL/hr  over 30 Minutes 07/12/19 0714   07/11/19 2000  azithromycin (ZITHROMAX) 500 mg in sodium chloride 0.9 % 250 mL IVPB  Status:  Discontinued     500 mg 250 mL/hr over 60 Minutes 07/13/19 1423   07/11/19 1900  cefTRIAXone (ROCEPHIN) 2 g in sodium chloride 0.9 % 100 mL IVPB     2 g 200 mL/hr over 30 Minutes 07/11/19 2025       Devices     LINES / TUBES:      Continuous Infusions: . phenylephrine (NEO-SYNEPHRINE) Adult infusion Stopped (07/13/19 0331)  . remdesivir 100 mg in NS 100 mL Stopped (07/13/19 1015)     Objective: Vitals:   07/14/19 0600 07/14/19 0630 07/14/19 0635 07/14/19 0700  BP: (!) 142/77 126/69  (!) 150/97  Pulse: 94  81 91  Resp:      Temp:      TempSrc:      SpO2: 92%  90% 91%  Weight:      Height:        Intake/Output Summary (Last 24 hours) at 07/14/2019 3354 Last data filed at 07/14/2019 0600 Gross per 24 hour  Intake --  Output 2900 ml  Net -2900 ml   Filed Weights   07/12/19 0320  Weight: 82.1 kg    Examination:  General: A/O x4, positive acute respiratory distress, hard of hearing Eyes: negative scleral hemorrhage, negative anisocoria, negative icterus ENT: Negative Runny nose, negative gingival bleeding, Neck:  Negative scars, masses, torticollis, lymphadenopathy, JVD Lungs: Bilateral decreased breath sounds, without wheezes or crackles Cardiovascular: Regular rate and rhythm without murmur gallop or rub normal S1 and S2 Abdomen: negative abdominal pain, nondistended, positive soft, bowel sounds, no rebound, no ascites, no appreciable mass Extremities: No significant cyanosis, clubbing, or edema bilateral lower extremities Skin: Negative rashes, lesions, ulcers Psychiatric:  Negative depression, negative anxiety, negative fatigue, negative mania  Central nervous system:  Cranial nerves II through XII intact, tongue/uvula midline, all extremities muscle strength 5/5, sensation intact throughout, negative dysarthria, negative expressive  aphasia, negative receptive aphasia.  .     Data Reviewed: Care during the described time interval was provided by me .  I have reviewed this patient's available data, including medical history, events of note, physical examination, and all test results as part of my evaluation.   CBC: Recent Labs  Lab 07/11/19 1908 07/13/19 0200 07/14/19 0453  WBC 2.6* 5.0 9.2  NEUTROABS 1.8 3.9 7.6  HGB 12.5 13.4 14.7  HCT 39.2 41.4 44.0  MCV 94.7 92.8 90.2  PLT 115* 157 562   Basic Metabolic Panel: Recent Labs  Lab 07/11/19 1908 07/13/19 0200 07/14/19 0453  NA 133* 139 139  K 4.1 4.8 4.0  CL 100 103 104  CO2 '25 25 25  '$ GLUCOSE 143* 109* 149*  BUN 21 27* 31*  CREATININE 1.03* 1.05* 1.05*  CALCIUM 8.2* 8.0* 8.3*  MG  --  1.6* 1.6*  PHOS  --  4.1 3.8   GFR: Estimated Creatinine Clearance: 30.9 mL/min (A) (by C-G formula based on SCr of 1.05 mg/dL (H)). Liver Function  Tests: Recent Labs  Lab 07/11/19 1908 07/13/19 0200 07/14/19 0453  AST 31 37 44*  ALT '13 13 17  '$ ALKPHOS 49 45 49  BILITOT 0.5 0.2* 0.5  PROT 6.2* 5.5* 5.9*  ALBUMIN 2.9* 2.4* 2.6*   No results for input(s): LIPASE, AMYLASE in the last 168 hours. No results for input(s): AMMONIA in the last 168 hours. Coagulation Profile: Recent Labs  Lab 07/11/19 1908  INR 1.1   Cardiac Enzymes: No results for input(s): CKTOTAL, CKMB, CKMBINDEX, TROPONINI in the last 168 hours. BNP (last 3 results) No results for input(s): PROBNP in the last 8760 hours. HbA1C: No results for input(s): HGBA1C in the last 72 hours. CBG: No results for input(s): GLUCAP in the last 168 hours. Lipid Profile: Recent Labs    07/11/19 1908  TRIG 71   Thyroid Function Tests: No results for input(s): TSH, T4TOTAL, FREET4, T3FREE, THYROIDAB in the last 72 hours. Anemia Panel: Recent Labs    07/11/19 1908 07/13/19 0200  FERRITIN 230 347*   Urine analysis: No results found for: COLORURINE, APPEARANCEUR, LABSPEC, PHURINE, GLUCOSEU,  HGBUR, BILIRUBINUR, KETONESUR, PROTEINUR, UROBILINOGEN, NITRITE, LEUKOCYTESUR Sepsis Labs: '@LABRCNTIP'$ (procalcitonin:4,lacticidven:4)  ) Recent Results (from the past 240 hour(s))  Blood Culture (routine x 2)     Status: None (Preliminary result)   Collection Time: 07/11/19  7:08 PM   Specimen: Right Antecubital; Blood  Result Value Ref Range Status   Specimen Description RIGHT ANTECUBITAL  Final   Special Requests   Final    BOTTLES DRAWN AEROBIC AND ANAEROBIC Blood Culture adequate volume   Culture   Final    NO GROWTH 2 DAYS Performed at [facility], [address], [place], [place] [address]    Report Status PENDING  Incomplete  Blood Culture (routine x 2)     Status: None (Preliminary result)   Collection Time: 07/11/19  7:08 PM   Specimen: Left Antecubital; Blood  Result Value Ref Range Status   Specimen Description LEFT ANTECUBITAL  Final   Special Requests   Final    BOTTLES DRAWN AEROBIC AND ANAEROBIC Blood Culture adequate volume   Culture   Final    NO GROWTH 2 DAYS Performed at [facility], [address] [place], [place] [address]    Report Status PENDING  Incomplete  MRSA PCR Screening     Status: None   Collection Time: 07/12/19  5:51 AM   Specimen: Nasopharyngeal  Result Value Ref Range Status   MRSA by PCR NEGATIVE NEGATIVE Final    Comment:        The GeneXpert MRSA Assay (FDA approved for NASAL specimens only), is one component of a comprehensive MRSA colonization surveillance program. It is not intended to diagnose MRSA infection nor to guide or monitor treatment for MRSA infections. Performed at [facility], [place] [address], [place], [place] [address]          Radiology Studies: No results found.      Scheduled Meds: . vitamin C  500 mg Oral Daily  . chlorhexidine  15 mL Mouth Rinse BID  . Chlorhexidine Gluconate Cloth  6 each Topical Daily  . dexamethasone (DECADRON) injection  6 mg Intravenous QHS  .  dextromethorphan-guaiFENesin  1 tablet Oral BID  . enoxaparin (LOVENOX) injection  40 mg Subcutaneous Q12H  . famotidine  20 mg Oral BID  . mouth rinse  15 mL Mouth Rinse q12n4p  . zinc sulfate  220 mg Oral Daily   Continuous Infusions: . phenylephrine (  NEO-SYNEPHRINE) Adult infusion Stopped (07/13/19 0331)  . remdesivir 100 mg in NS 100 mL Stopped (07/13/19 1015)     LOS: 3 days   The patient is critically ill with multiple organ systems failure and requires high complexity decision making for assessment and support, frequent evaluation and titration of therapies, application of advanced monitoring technologies and extensive interpretation of multiple databases. Critical Care Time devoted to patient care services described in this note  Time spent: 40 minutes     Tanesia Butner, Geraldo Docker, MD [facility] Pager [phone]  If 7PM-7AM, please contact night-coverage [email or web address] Password [facility] 07/14/2019, 7:29 AM

## 2019-07-15 DIAGNOSIS — F319 Bipolar disorder, unspecified: Secondary | ICD-10-CM | POA: Diagnosis present

## 2019-07-15 DIAGNOSIS — J410 Simple chronic bronchitis: Secondary | ICD-10-CM

## 2019-07-15 DIAGNOSIS — R410 Disorientation, unspecified: Secondary | ICD-10-CM

## 2019-07-15 LAB — CBC WITH DIFFERENTIAL/PLATELET
Abs Immature Granulocytes: 0.11 10*3/uL — ABNORMAL HIGH (ref 0.00–0.07)
Basophils Absolute: 0.1 10*3/uL (ref 0.0–0.1)
Basophils Relative: 2 %
Eosinophils Absolute: 0 10*3/uL (ref 0.0–0.5)
Eosinophils Relative: 0 %
HCT: 42.9 % (ref 36.0–46.0)
Hemoglobin: 13.7 g/dL (ref 12.0–15.0)
Immature Granulocytes: 2 %
Lymphocytes Relative: 13 %
Lymphs Abs: 0.6 10*3/uL — ABNORMAL LOW (ref 0.7–4.0)
MCH: 29.7 pg (ref 26.0–34.0)
MCHC: 31.9 g/dL (ref 30.0–36.0)
MCV: 93.1 fL (ref 80.0–100.0)
Monocytes Absolute: 0.3 10*3/uL (ref 0.1–1.0)
Monocytes Relative: 6 %
Neutro Abs: 3.6 10*3/uL (ref 1.7–7.7)
Neutrophils Relative %: 77 %
Platelets: 275 10*3/uL (ref 150–400)
RBC: 4.61 MIL/uL (ref 3.87–5.11)
RDW: 13.2 % (ref 11.5–15.5)
WBC: 4.7 10*3/uL (ref 4.0–10.5)
nRBC: 0 % (ref 0.0–0.2)

## 2019-07-15 LAB — C-REACTIVE PROTEIN: CRP: 4.2 mg/dL — ABNORMAL HIGH (ref <1.0)

## 2019-07-15 LAB — COMPREHENSIVE METABOLIC PANEL
ALT: 25 U/L (ref 0–44)
AST: 54 U/L — ABNORMAL HIGH (ref 15–41)
Albumin: 2.5 g/dL — ABNORMAL LOW (ref 3.5–5.0)
Alkaline Phosphatase: 51 U/L (ref 38–126)
Anion gap: 13 (ref 5–15)
BUN: 38 mg/dL — ABNORMAL HIGH (ref 8–23)
CO2: 23 mmol/L (ref 22–32)
Calcium: 8.2 mg/dL — ABNORMAL LOW (ref 8.9–10.3)
Chloride: 106 mmol/L (ref 98–111)
Creatinine, Ser: 0.82 mg/dL (ref 0.44–1.00)
GFR calc Af Amer: >60 mL/min (ref >60)
GFR calc non Af Amer: 59 mL/min — ABNORMAL LOW (ref >60)
Glucose, Bld: 182 mg/dL — ABNORMAL HIGH (ref 70–99)
Potassium: 4.8 mmol/L (ref 3.5–5.1)
Sodium: 142 mmol/L (ref 135–145)
Total Bilirubin: 0.6 mg/dL (ref 0.3–1.2)
Total Protein: 5.6 g/dL — ABNORMAL LOW (ref 6.5–8.1)

## 2019-07-15 LAB — PHOSPHORUS: Phosphorus: 3.3 mg/dL (ref 2.5–4.6)

## 2019-07-15 LAB — MAGNESIUM: Magnesium: 2.1 mg/dL (ref 1.7–2.4)

## 2019-07-15 LAB — PROCALCITONIN: Procalcitonin: 0.41 ng/mL

## 2019-07-15 LAB — FERRITIN: Ferritin: 319 ng/mL — ABNORMAL HIGH (ref 11–307)

## 2019-07-15 LAB — D-DIMER, QUANTITATIVE: D-Dimer, Quant: 0.75 ug/mL-FEU — ABNORMAL HIGH (ref 0.00–0.50)

## 2019-07-15 MED ORDER — DIVALPROEX SODIUM 250 MG PO DR TAB
250.0000 mg | DELAYED_RELEASE_TABLET | Freq: Two times a day (BID) | ORAL | Status: DC
  Administered 2019-07-15 – 2019-07-22 (×14): 250 mg via ORAL
  Filled 2019-07-15 (×15): qty 1

## 2019-07-15 MED ORDER — HYDRALAZINE HCL 20 MG/ML IJ SOLN
5.0000 mg | Freq: Four times a day (QID) | INTRAMUSCULAR | Status: DC
  Administered 2019-07-15 (×2): 5 mg via INTRAVENOUS
  Filled 2019-07-15 (×2): qty 1

## 2019-07-15 MED ORDER — BENZTROPINE MESYLATE 0.5 MG PO TABS
0.5000 mg | ORAL_TABLET | Freq: Every day | ORAL | Status: DC
  Administered 2019-07-15 – 2019-07-22 (×8): 0.5 mg via ORAL
  Filled 2019-07-15 (×8): qty 1

## 2019-07-15 MED ORDER — HYDRALAZINE HCL 20 MG/ML IJ SOLN: 5.0000 mg | Freq: Four times a day (QID) | INTRAMUSCULAR | Status: DC | PRN

## 2019-07-15 MED ORDER — METOPROLOL TARTRATE 5 MG/5ML IV SOLN
5.0000 mg | INTRAVENOUS | Status: DC
  Administered 2019-07-15: 5 mg via INTRAVENOUS
  Filled 2019-07-15: qty 5

## 2019-07-15 MED ORDER — RISPERIDONE 1 MG PO TABS
1.0000 mg | ORAL_TABLET | Freq: Every day | ORAL | Status: DC
  Administered 2019-07-15 – 2019-07-21 (×7): 1 mg via ORAL
  Filled 2019-07-15 (×8): qty 1

## 2019-07-15 MED ORDER — DONEPEZIL HCL 10 MG PO TABS
5.0000 mg | ORAL_TABLET | Freq: Every day | ORAL | Status: DC
  Administered 2019-07-15 – 2019-07-21 (×7): 5 mg via ORAL
  Filled 2019-07-15 (×7): qty 1

## 2019-07-15 MED ORDER — ATENOLOL 25 MG PO TABS
25.0000 mg | ORAL_TABLET | Freq: Every day | ORAL | Status: DC
  Administered 2019-07-15 – 2019-07-22 (×8): 25 mg via ORAL
  Filled 2019-07-15 (×8): qty 1

## 2019-07-15 NOTE — Evaluation (Signed)
Clinical/Bedside Swallow Evaluation Patient Details  Name: Isabella Campbell MRN: [id number] Date of Birth: Sep 26, 1917  Today's Date: 07/15/2019 Time: SLP Start Time (ACUTE ONLY): 3419 SLP Stop Time (ACUTE ONLY): 0933 SLP Time Calculation (min) (ACUTE ONLY): 12 min  Past Medical History:  Past Medical History:  Diagnosis Date  . Bipolar 1 disorder ([place])   . COPD (chronic obstructive pulmonary disease) ([place])    CT chest 11/2005 Dr. Koleen Nimrod  . Coronary artery disease    minimal bilateral disease  . Hypertension   . Pulmonary hypertension ([place])    Past Surgical History:  Past Surgical History:  Procedure Laterality Date  . CATARACT EXTRACTION Right   . CATARACT EXTRACTION W/PHACO Left 12/01/2017   Procedure: CATARACT EXTRACTION PHACO AND INTRAOCULAR LENS PLACEMENT (IOC);  Surgeon: Baruch Goldmann, MD;  Location: [facility];  Service: Ophthalmology;  Laterality: Left;  CDE: 11.86  . DILATION AND CURETTAGE OF UTERUS    . hemrrhoidectomy     HPI:  Isabella Campbell is a 85 y.o. female from [place] with medical history significant for COPD, CAD who was sent to the ED due to altered mental status. Per chart patient was recently diagnosed with UTI. Chest x-ray . Increased airspace opacity in the right lung, concerning for   Assessment / Plan / Recommendation Clinical Impression  Pt exhibited oropharyngeal swallow ability deemed within normal limits in context of advanced age and + Covid. SHe was alert, followed commands for oral-motor exam (ne deficits). Consumed thin via cup and straw, applesauce and solid cracker withone delayed cough at end- suspect related to pneumonia. Cleared oral cavity independently. She is eger to eat breakfast and therapist recommeding regular, thin liquids, straws allowed, and pills with thin. No further ST needed at this time.  SLP Visit Diagnosis: Dysphagia, unspecified (R13.10)    Aspiration Risk  Mild aspiration risk    Diet Recommendation  Regular;Thin liquid   Liquid Administration via: Cup;Straw Medication Administration: Whole meds with liquid Supervision: Patient able to self feed;Intermittent supervision to cue for compensatory strategies Compensations: Slow rate;Small sips/bites Postural Changes: Seated upright at 90 degrees    Other  Recommendations Oral Care Recommendations: Oral care BID   Follow up Recommendations None      Frequency and Duration            Prognosis        Swallow Study   General HPI: Isabella Campbell is a 85 y.o. female from [place] with medical history significant for COPD, CAD who was sent to the ED due to altered mental status. Per chart patient was recently diagnosed with UTI. Chest x-ray . Increased airspace opacity in the right lung, concerning for Type of Study: Bedside Swallow Evaluation Previous Swallow Assessment: (none) Diet Prior to this Study: NPO Temperature Spikes Noted: No Respiratory Status: Other (comment)(HFNC) History of Recent Intubation: No Behavior/Cognition: Alert;Cooperative;Pleasant mood Oral Cavity Assessment: Within Functional Limits Oral Care Completed by SLP: No Oral Cavity - Dentition: Adequate natural dentition(missing several posterior) Vision: Functional for self-feeding Self-Feeding Abilities: Able to feed self;Needs set up Patient Positioning: Upright in bed Baseline Vocal Quality: Normal Volitional Cough: Strong Volitional Swallow: Able to elicit    Oral/Motor/Sensory Function Overall Oral Motor/Sensory Function: Within functional limits   Ice Chips Ice chips: Within functional limits Presentation: Spoon   Thin Liquid Thin Liquid: Within functional limits Presentation: Cup;Straw    Nectar Thick Nectar Thick Liquid: Not tested   Honey Thick Honey Thick Liquid: Not tested  Puree Puree: Within functional limits   Solid     Solid: Within functional limits      Royce Macadamia 07/15/2019,9:39 AM  Breck Coons  Lonell Face.Ed Nurse, children's [phone] Office [phone]

## 2019-07-15 NOTE — Progress Notes (Addendum)
PROGRESS NOTE    Isabella Campbell  TDD:[id number] DOB: 1918-02-12 DOA: 07/11/2019 PCP: Glenda Chroman, MD   Brief Narrative:  85 y.o. WF from [facility] facility PMHx bipolar 1 disorder, essential HTN, pulmonary HTN, CAD, COPD,  Sent to the [facility], ED due to altered mental status.  Baseline mental status unknown at this time due to patient being unable to provide history.  History was obtained from ED physician and ED chart.  Per report, patient was recently diagnosed with UTI and was started on Keflex.  ED Course: In the ED emergency room, patient was noted to be febrile with a temperature of 100.5, she was tachycardic and tachypneic with a BP of 175/84, O2 sats was 94% on supplemental oxygen via [place] at 2 LPM.  Work-up in the ED showed leukopenia, thrombocytopenia, hypoalbuminemia.  CRP was 4.9, D-dimer 1.46, fibrinogen 516, LDH 260, procalcitonin was negative, ferritin 230. POC SARS coronavirus 2 Ag test positive.  Chest x-ray shows COPD without acute airspace disease.  She was initially treated with IV ceftriaxone due to presumed CAP.  Hospitalist was asked to admit patient for further evaluation and management.   Subjective: 12/28 last 24 hours afebrile A/O x3 (does not know where), perseveration (keeps repeating I want to place against the wall and want to place against the wall).   Assessment & Plan:   Principal Problem:   COVID-19 virus infection Active Problems:   Essential hypertension   COPD (chronic obstructive pulmonary disease) (HCC)   Acute respiratory failure with hypoxia (HCC)   Altered mental status   Pressure injury of skin   Bipolar 1 disorder (HCC)  Covid pneumonia/acute respiratory failure with hypoxia COVID-19 Labs  Recent Labs    07/13/19 0200 07/14/19 0453 07/15/19 0600  DDIMER 1.35* 1.01* 0.75*  FERRITIN 347* 377* 319*  CRP 7.3* 6.8* 4.2*    12/24 POC SARS coronavirus positive -Decadron 6 mg daily -Remdesivir per pharmacy  protocol -Combivent QID -Vitamins per Covid protocol -Titrate O2 to maintain SPO2> 88% -Prone patient 16 hours/day; if cannot tolerate 2 to 3 hours per shift  Sepsis/aspiration pneumonia -Patient initially met criteria for SIRS, tachycardic, tachypneic, leukopenic and febrile. -CXR s/p intubation showed increased airspace opacity in the right lung concerning for aspiration pneumonia -Initially started on empiric antibiotics.  Have been discontinued.   -Continue to trend procalcitonin restart if procalcitonin trends up, leukocytosis, fever.  Leukopenia/ Thrombocytopenia/lymphopenia possibly secondary to above vs reactive -Resolved  Altered Mental Status  -Unknown baseline.  Today only A/O x3 (does not know where) with perseveration -May be some hospital delirium, baseline dementia however it does not improve by the a.m. Will obtain head CT  Bipolar 1 disorder -12/28 restarted bipolar medication  Essential hypertension -12/28 Atenolol 25 mg daily (home dose) -12/28 change scheduled hydralazine to PRN  History of COPD  -See Covid pneumonia  Dysphagia -N.p.o. until swallow evaluation -12/27 swallow eval pending  Hypomagnesmia -Magnesium goal> 2    DVT prophylaxis: Lovenox Code Status: DNR Family Communication: 12/28 spoke at length with Thayer Headings (daughter) who stated mother is very high functioning lives in an assisted living facility but take cares of all of her ADLs.  Is always A/O x4 except when physically ill or's decompensates psychiatrically (has only occurred 2 or 3 times in her lifetime).  Discussed plan of care answered all questions Disposition Plan:    Consultants:  PCCM  Procedures/Significant Events:     I have personally reviewed and interpreted all radiology studies and my  findings are as above.  VENTILATOR SETTINGS: HFNC 12/28 Flow rate; 6 L/min SPO2 81%    Cultures   Antimicrobials: Anti-infectives (From admission, onward)   Start      Dose/Rate Stop   07/13/19 1000  remdesivir 100 mg in sodium chloride 0.9 % 100 mL IVPB     100 mg 200 mL/hr over 30 Minutes 07/17/19 0959   07/12/19 2000  cefTRIAXone (ROCEPHIN) 1 g in sodium chloride 0.9 % 100 mL IVPB  Status:  Discontinued     1 g 200 mL/hr over 30 Minutes 07/13/19 1423   07/12/19 0600  clindamycin (CLEOCIN) IVPB 600 mg  Status:  Discontinued     600 mg 100 mL/hr over 30 Minutes 07/12/19 0930   07/12/19 0230  remdesivir 200 mg in sodium chloride 0.9% 250 mL IVPB     200 mg 580 mL/hr over 30 Minutes 07/12/19 0714   07/11/19 2000  azithromycin (ZITHROMAX) 500 mg in sodium chloride 0.9 % 250 mL IVPB  Status:  Discontinued     500 mg 250 mL/hr over 60 Minutes 07/13/19 1423   07/11/19 1900  cefTRIAXone (ROCEPHIN) 2 g in sodium chloride 0.9 % 100 mL IVPB     2 g 200 mL/hr over 30 Minutes 07/11/19 2025       Devices     LINES / TUBES:      Continuous Infusions: . phenylephrine (NEO-SYNEPHRINE) Adult infusion Stopped (07/13/19 0331)  . remdesivir 100 mg in NS 100 mL 100 mg (07/15/19 1042)     Objective: Vitals:   07/15/19 0400 07/15/19 0756 07/15/19 1100 07/15/19 1556  BP: (!) 144/84 (!) 182/94 134/68 131/75  Pulse: 81 90 80 89  Resp:  '18 18 18  '$ Temp:  98.2 F (36.8 C) 98.5 F (36.9 C) (!) 97.4 F (36.3 C)  TempSrc:  Oral Axillary Oral  SpO2: 93% 90% 92% 94%  Weight:      Height:        Intake/Output Summary (Last 24 hours) at 07/15/2019 1851 Last data filed at 07/15/2019 1555 Gross per 24 hour  Intake 200 ml  Output 495 ml  Net -295 ml   Filed Weights   07/12/19 0320  Weight: 82.1 kg   Physical Exam:  General: A/O x3, (does not know where), positive acute respiratory distress, cachectic Eyes: negative scleral hemorrhage, negative anisocoria, negative icterus ENT: Negative Runny nose, negative gingival bleeding, Neck:  Negative scars, masses, torticollis, lymphadenopathy, JVD Lungs: Diffuse decreased breath sounds, without wheezes or  crackles Cardiovascular: Regular rate and rhythm without murmur gallop or rub normal S1 and S2 Abdomen: negative abdominal pain, nondistended, positive soft, bowel sounds, no rebound, no ascites, no appreciable mass Extremities: No significant cyanosis, clubbing, or edema bilateral lower extremities Skin: Negative rashes, lesions, ulcers Psychiatric: Unable to fully evaluate Central nervous system:  Cranial nerves II through XII intact, tongue/uvula midline, all extremities muscle strength 5/5, sensation intact throughout, positive perseveration, positive expressive aphasia, negative receptive aphasia .     Data Reviewed: Care during the described time interval was provided by me .  I have reviewed this patient's available data, including medical history, events of note, physical examination, and all test results as part of my evaluation.   CBC: Recent Labs  Lab 07/11/19 1908 07/13/19 0200 07/14/19 0453 07/15/19 0600  WBC 2.6* 5.0 9.2 4.7  NEUTROABS 1.8 3.9 7.6 3.6  HGB 12.5 13.4 14.7 13.7  HCT 39.2 41.4 44.0 42.9  MCV 94.7 92.8 90.2 93.1  PLT 115*  157 264 798   Basic Metabolic Panel: Recent Labs  Lab 07/11/19 1908 07/13/19 0200 07/14/19 0453 07/15/19 0600  NA 133* 139 139 142  K 4.1 4.8 4.0 4.8  CL 100 103 104 106  CO2 '25 25 25 23  '$ GLUCOSE 143* 109* 149* 182*  BUN 21 27* 31* 38*  CREATININE 1.03* 1.05* 1.05* 0.82  CALCIUM 8.2* 8.0* 8.3* 8.2*  MG  --  1.6* 1.6* 2.1  PHOS  --  4.1 3.8 3.3   GFR: Estimated Creatinine Clearance: 39.5 mL/min (by C-G formula based on SCr of 0.82 mg/dL). Liver Function Tests: Recent Labs  Lab 07/11/19 1908 07/13/19 0200 07/14/19 0453 07/15/19 0600  AST 31 37 44* 54*  ALT '13 13 17 25  '$ ALKPHOS 49 45 49 51  BILITOT 0.5 0.2* 0.5 0.6  PROT 6.2* 5.5* 5.9* 5.6*  ALBUMIN 2.9* 2.4* 2.6* 2.5*   No results for input(s): LIPASE, AMYLASE in the last 168 hours. No results for input(s): AMMONIA in the last 168 hours. Coagulation  Profile: Recent Labs  Lab 07/11/19 1908  INR 1.1   Cardiac Enzymes: No results for input(s): CKTOTAL, CKMB, CKMBINDEX, TROPONINI in the last 168 hours. BNP (last 3 results) No results for input(s): PROBNP in the last 8760 hours. HbA1C: No results for input(s): HGBA1C in the last 72 hours. CBG: Recent Labs  Lab 07/14/19 0733  GLUCAP 131*   Lipid Profile: No results for input(s): CHOL, HDL, LDLCALC, TRIG, CHOLHDL, LDLDIRECT in the last 72 hours. Thyroid Function Tests: No results for input(s): TSH, T4TOTAL, FREET4, T3FREE, THYROIDAB in the last 72 hours. Anemia Panel: Recent Labs    07/14/19 0453 07/15/19 0600  FERRITIN 377* 319*   Urine analysis: No results found for: COLORURINE, APPEARANCEUR, LABSPEC, PHURINE, GLUCOSEU, HGBUR, BILIRUBINUR, KETONESUR, PROTEINUR, UROBILINOGEN, NITRITE, LEUKOCYTESUR Sepsis Labs: '@LABRCNTIP'$ (procalcitonin:4,lacticidven:4)  ) Recent Results (from the past 240 hour(s))  Blood Culture (routine x 2)     Status: None (Preliminary result)   Collection Time: 07/11/19  7:08 PM   Specimen: Right Antecubital; Blood  Result Value Ref Range Status   Specimen Description RIGHT ANTECUBITAL  Final   Special Requests   Final    BOTTLES DRAWN AEROBIC AND ANAEROBIC Blood Culture adequate volume   Culture   Final    NO GROWTH 4 DAYS Performed at [facility], [address] [place], [place] [address]    Report Status PENDING  Incomplete  Blood Culture (routine x 2)     Status: None (Preliminary result)   Collection Time: 07/11/19  7:08 PM   Specimen: Left Antecubital; Blood  Result Value Ref Range Status   Specimen Description LEFT ANTECUBITAL  Final   Special Requests   Final    BOTTLES DRAWN AEROBIC AND ANAEROBIC Blood Culture adequate volume   Culture   Final    NO GROWTH 4 DAYS Performed at [facility], [address] [place], [place] [address]    Report Status PENDING  Incomplete  MRSA PCR Screening     Status: None   Collection Time:  07/12/19  5:51 AM   Specimen: Nasopharyngeal  Result Value Ref Range Status   MRSA by PCR NEGATIVE NEGATIVE Final    Comment:        The GeneXpert MRSA Assay (FDA approved for NASAL specimens only), is one component of a comprehensive MRSA colonization surveillance program. It is not intended to diagnose MRSA infection nor to guide or monitor treatment for MRSA infections. Performed at [facility], [address] [place], [place] [address]          Radiology Studies: No results found.      Scheduled Meds: . vitamin C  500 mg Oral Daily  . atenolol  25 mg Oral Daily  . benztropine  0.5 mg Oral Daily  . chlorhexidine  15 mL Mouth Rinse BID  . Chlorhexidine Gluconate Cloth  6 each Topical Daily  . dexamethasone (DECADRON) injection  6 mg Intravenous QHS  . dextromethorphan-guaiFENesin  1 tablet Oral BID  . divalproex  250 mg Oral BID  . donepezil  5 mg Oral QHS  . enoxaparin (LOVENOX) injection  40 mg Subcutaneous Q12H  . famotidine  20 mg Oral BID  . Ipratropium-Albuterol  1 puff Inhalation QID  . mouth rinse  15 mL Mouth Rinse q12n4p  . risperiDONE  1 mg Oral QHS  . zinc sulfate  220 mg Oral Daily   Continuous Infusions: . phenylephrine (NEO-SYNEPHRINE) Adult infusion Stopped (07/13/19 0331)  . remdesivir 100 mg in NS 100 mL 100 mg (07/15/19 1042)     LOS: 4 days   The patient is critically ill with multiple organ systems failure and requires high complexity decision making for assessment and support, frequent evaluation and titration of therapies, application of advanced monitoring technologies and extensive interpretation of multiple databases. Critical Care Time devoted to patient care services described in this note  Time spent: 40 minutes     Daley Gosse, Geraldo Docker, MD [facility] Pager [phone]  If 7PM-7AM, please contact night-coverage [email or web address] Password [facility] 07/15/2019, 6:51 PM

## 2019-07-15 NOTE — Evaluation (Signed)
Occupational Therapy Evaluation Patient Details Name: Isabella Campbell MRN: [id number] DOB: [birth year] Today's Date: 07/15/2019    History of Present Illness  85 yo female presenting to [facility] ED with [place]. Tested COVID-19 positive. PMH including bipolar 1 disorder, essential HTN, pulmonary HTN, CAD,and COPD.   Clinical Impression   PTA, pt was living with [facility] ILF and performed BADLs and used RW for mobility; limited information collected due to significant HOH. Pt currently requiring Mod A for LB ADLs and Min A for functional transfers with RW. Pt presenting with decreased balance, strength, and activity tolerance. SpO2 >90% on 6L via HFNC. Pt would benefit from further acute OT to facilitate safe dc. Recommend dc to SNF for further OT to optimize safety, independence with ADLs, and return to PLOF.      Follow Up Recommendations  SNF;Supervision/Assistance - 24 hour    Equipment Recommendations  Other (comment)(Defer to next venue)    Recommendations for Other Services PT consult     Precautions / Restrictions Precautions Precautions: Fall      Mobility Bed Mobility Overal bed mobility: Needs Assistance Bed Mobility: Supine to Sit     Supine to sit: Min guard;HOB elevated     General bed mobility comments: Min Guard A for safety. No physical A needed  Transfers Overall transfer level: Needs assistance Equipment used: Rolling walker (2 wheeled) Transfers: Sit to/from [facility] Sit to Stand: Min assist;+2 safety/equipment Stand pivot transfers: Min assist;+2 safety/equipment       General transfer comment: Min A for safety and balance.     Balance Overall balance assessment: Needs assistance Sitting-balance support: No upper extremity supported;Feet supported Sitting balance-Leahy Scale: Fair     Standing balance support: Bilateral upper extremity supported;During functional activity Standing balance-Leahy Scale: Poor Standing  balance comment: Reliant on physical A and UE support                           ADL either performed or assessed with clinical judgement   ADL Overall ADL's : Needs assistance/impaired Eating/Feeding: Set up;Sitting   Grooming: Set up;Sitting   Upper Body Bathing: Sitting;Min guard   Lower Body Bathing: Moderate assistance;Sit to/from stand   Upper Body Dressing : Min guard;Sitting   Lower Body Dressing: Moderate assistance;Sit to/from stand   Toilet Transfer: Minimal assistance;+2 for safety/equipment;Stand-pivot;RW Toilet Transfer Details (indicate cue type and reason): Min A for balance and safety         Functional mobility during ADLs: Minimal assistance;Rolling walker(stand pivot to recliner) General ADL Comments: Pt presenting with decreased balance, strength, and activity tolerance. Motivated to participate     Vision Baseline Vision/History: Wears glasses Wears Glasses: At all times Patient Visual Report: No change from baseline       Perception     Praxis      Pertinent Vitals/Pain Pain Assessment: No/denies pain     Hand Dominance     Extremity/Trunk Assessment Upper Extremity Assessment Upper Extremity Assessment: Generalized weakness   Lower Extremity Assessment Lower Extremity Assessment: Defer to PT evaluation   Cervical / Trunk Assessment Cervical / Trunk Assessment: Kyphotic   Communication Communication Communication: HOH   Cognition Arousal/Alertness: Awake/alert Behavior During Therapy: WFL for tasks assessed/performed Overall Cognitive Status: Difficult to assess                                 General  Comments: Pt following commands. However, very HOH and difficult to confirm PLOF and home information   General Comments  SpO2 in 90s on 6L via HFNC.     Exercises     Shoulder Instructions      Home Living Family/patient expects to be discharged to:: Assisted living                              Home Equipment: Walker - 2 wheels   Additional Comments: Pt reports that she lives in her house at [facility] IL[facility].       Prior Functioning/Environment Level of Independence: Independent with assistive device(s)        Comments: Pt reports that she uses RW for mobility and performs BADLs. Reports staff assist with IADLs. Inconsistent information provided due to [facility].        OT Problem List: Decreased range of motion;Decreased strength;Decreased activity tolerance;Impaired balance (sitting and/or standing);Decreased knowledge of use of DME or AE;Decreased knowledge of precautions      OT Treatment/Interventions: Self-care/ADL training;Therapeutic exercise;Energy conservation;DME and/or AE instruction;Therapeutic activities;Patient/family education    OT Goals(Current goals can be found in the care plan section) Acute Rehab OT Goals Patient Stated Goal: "Sit in the chair" OT Goal Formulation: With patient Time For Goal Achievement: 07/29/19 Potential to Achieve Goals: Good  OT Frequency: Min 3X/week   Barriers to D/C:            Co-evaluation PT/OT/SLP Co-Evaluation/Treatment: Yes Reason for Co-Treatment: To address functional/ADL transfers;Complexity of the patient's impairments (multi-system involvement)(For fatigue)   OT goals addressed during session: ADL's and self-care      AM-PAC OT "6 Clicks" Daily Activity     Outcome Measure Help from another person eating meals?: None Help from another person taking care of personal grooming?: A Little Help from another person toileting, which includes using toliet, bedpan, or urinal?: A Little Help from another person bathing (including washing, rinsing, drying)?: A Lot Help from another person to put on and taking off regular upper body clothing?: A Little Help from another person to put on and taking off regular lower body clothing?: A Lot 6 Click Score: 17   End of Session Equipment Utilized During Treatment: Rolling  walker;Oxygen(6L) Nurse Communication: Mobility status  Activity Tolerance: Patient limited by fatigue Patient left: in chair;with call bell/phone within reach;with chair alarm set  OT Visit Diagnosis: Unsteadiness on feet (R26.81);Other abnormalities of gait and mobility (R26.89);Muscle weakness (generalized) (M62.81)                Time: 2440-1027 OT Time Calculation (min): 29 min Charges:  OT General Charges $OT Visit: 1 Visit OT Evaluation $OT Eval Moderate Complexity: 1 Mod  Inari Shin MSOT, OTR/L Acute Rehab Pager: [phone] Office: [phone]  Theodoro Grist Kennede Lusk 07/15/2019, 1:26 PM

## 2019-07-15 NOTE — Progress Notes (Signed)
Pt transferred to 1C RM 162. Report given to [employer]. Pt's belongings transferred with pt. No issues during transfer.

## 2019-07-15 NOTE — Progress Notes (Signed)
Pt daughter updated and all questions answered. Daughter made aware of transfer from ICU to PCU.

## 2019-07-15 NOTE — TOC Initial Note (Signed)
Transition of Care [facility]) - Initial/Assessment Note    Patient Details  Name: Isabella Campbell MRN: [id number] Date of Birth: 1918/03/17  Transition of Care [facility]) CM/SW Contact:    Shade Flood, LCSW Phone Number: 07/15/2019, 9:29 AM  Clinical Narrative:                  Pt resides at [facility] in [place]. This is an independent living community for seniors. They do have services such as meal provision, medication management, and housekeeping but this is not an assisted living facility per se. Pt was presumed to be relatively independent prior to admission.  PT/OT evaluations are pending. TOC will follow up once recommendations for next level of care are made.    Expected Discharge Plan: [place] Barriers to Discharge: Continued Medical Work up   Patient Goals and CMS Choice        Expected Discharge Plan and Services Expected Discharge Plan: [place] In-house Referral: Clinical Social Work     Living arrangements for the past 2 months: [place]                                      Prior Living Arrangements/Services Living arrangements for the past 2 months: [place] Lives with:: Self Patient language and need for interpreter reviewed:: Yes        Need for Family Participation in Patient Care: No (Comment) Care giver support system in place?: Yes (comment)   Criminal Activity/Legal Involvement Pertinent to Current Situation/Hospitalization: No - Comment as needed  Activities of Daily Living      Permission Sought/Granted                  Emotional Assessment Appearance:: Appears younger than stated age     Orientation: : Oriented to Self, Oriented to Place Alcohol / Substance Use: Not Applicable Psych Involvement: No (comment)  Admission diagnosis:  Hypoxia [R09.02] Pneumonia due to COVID-19 virus [U07.1, J12.89] COVID-19 [U07.1] Patient Active Problem List   Diagnosis Date Noted  . Acute respiratory failure with hypoxia ([place]) 07/12/2019  . Altered mental status 07/12/2019  . Pressure injury of skin 07/12/2019  . COVID-19 virus infection 07/11/2019  . Guaiac positive stools 08/17/2016  . DYSLIPIDEMIA 12/09/2009  . CAROTID ARTERY DISEASE 05/06/2009  . COPD (chronic obstructive pulmonary disease) ([place]) 05/06/2009  . MURMUR 05/06/2009  . Essential hypertension 04/15/2009  . PULMONARY HYPERTENSION 04/15/2009   PCP:  Glenda Chroman, MD Pharmacy:   [facility] [place] [address] Phone: [phone] Fax: [phone]     Social Determinants of Health (SDOH) Interventions    Readmission Risk Interventions Readmission Risk Prevention Plan 07/15/2019  Transportation Screening Complete  Medication Review (RN CM) Complete  Some recent data might be hidden

## 2019-07-16 DIAGNOSIS — F319 Bipolar disorder, unspecified: Secondary | ICD-10-CM

## 2019-07-16 LAB — CULTURE, BLOOD (ROUTINE X 2)
Culture: NO GROWTH
Culture: NO GROWTH
Special Requests: ADEQUATE
Special Requests: ADEQUATE

## 2019-07-16 LAB — CBC WITH DIFFERENTIAL/PLATELET
Abs Immature Granulocytes: 0.21 10*3/uL — ABNORMAL HIGH (ref 0.00–0.07)
Basophils Absolute: 0 10*3/uL (ref 0.0–0.1)
Basophils Relative: 1 %
Eosinophils Absolute: 0 10*3/uL (ref 0.0–0.5)
Eosinophils Relative: 0 %
HCT: 38 % (ref 36.0–46.0)
Hemoglobin: 12.4 g/dL (ref 12.0–15.0)
Immature Granulocytes: 4 %
Lymphocytes Relative: 14 %
Lymphs Abs: 0.7 10*3/uL (ref 0.7–4.0)
MCH: 30 pg (ref 26.0–34.0)
MCHC: 32.6 g/dL (ref 30.0–36.0)
MCV: 92 fL (ref 80.0–100.0)
Monocytes Absolute: 0.3 10*3/uL (ref 0.1–1.0)
Monocytes Relative: 6 %
Neutro Abs: 3.8 10*3/uL (ref 1.7–7.7)
Neutrophils Relative %: 75 %
Platelets: 304 10*3/uL (ref 150–400)
RBC: 4.13 MIL/uL (ref 3.87–5.11)
RDW: 13.3 % (ref 11.5–15.5)
WBC: 5.1 10*3/uL (ref 4.0–10.5)
nRBC: 0 % (ref 0.0–0.2)

## 2019-07-16 LAB — COMPREHENSIVE METABOLIC PANEL
ALT: 45 U/L — ABNORMAL HIGH (ref 0–44)
AST: 78 U/L — ABNORMAL HIGH (ref 15–41)
Albumin: 2.6 g/dL — ABNORMAL LOW (ref 3.5–5.0)
Alkaline Phosphatase: 49 U/L (ref 38–126)
Anion gap: 7 (ref 5–15)
BUN: 41 mg/dL — ABNORMAL HIGH (ref 8–23)
CO2: 27 mmol/L (ref 22–32)
Calcium: 8.3 mg/dL — ABNORMAL LOW (ref 8.9–10.3)
Chloride: 108 mmol/L (ref 98–111)
Creatinine, Ser: 0.73 mg/dL (ref 0.44–1.00)
GFR calc Af Amer: >60 mL/min (ref >60)
GFR calc non Af Amer: >60 mL/min (ref >60)
Glucose, Bld: 168 mg/dL — ABNORMAL HIGH (ref 70–99)
Potassium: 4.3 mmol/L (ref 3.5–5.1)
Sodium: 142 mmol/L (ref 135–145)
Total Bilirubin: 0.9 mg/dL (ref 0.3–1.2)
Total Protein: 5.3 g/dL — ABNORMAL LOW (ref 6.5–8.1)

## 2019-07-16 LAB — MAGNESIUM: Magnesium: 2.1 mg/dL (ref 1.7–2.4)

## 2019-07-16 LAB — PHOSPHORUS: Phosphorus: 3.1 mg/dL (ref 2.5–4.6)

## 2019-07-16 LAB — C-REACTIVE PROTEIN: CRP: 2.3 mg/dL — ABNORMAL HIGH (ref <1.0)

## 2019-07-16 LAB — D-DIMER, QUANTITATIVE: D-Dimer, Quant: 0.75 ug/mL-FEU — ABNORMAL HIGH (ref 0.00–0.50)

## 2019-07-16 LAB — FERRITIN: Ferritin: 278 ng/mL (ref 11–307)

## 2019-07-16 LAB — PROCALCITONIN: Procalcitonin: 0.19 ng/mL

## 2019-07-16 NOTE — Progress Notes (Signed)
Occupational Therapy Treatment Patient Details Name: Isabella Campbell MRN: [id number] DOB: 07-Jan-1918 Today's Date: 07/16/2019    History of present illness 85 y.o. female from [facility] with medical history significant for COPD, CAD, bipolar disorder,  who was sent to the ED at [facility] due to altered mental status, fever, tachycardia, tachypnea, +COVID Intubated 12/25-12/26; transfer out of ICU 07/15/19   OT comments  Pt continues to present with decreased activity tolerance as seen by fatigue and decreased engagement in ADLs/mobility. Pt requiring increased cues for encouragement and Min A for functional mobility (short distance) with RW. Pt declining to perform ADLs stating "I want to go back to bed". Continue to recommend dc to SNF and will continue to follow acutely as admitted.    Follow Up Recommendations  SNF;Supervision/Assistance - 24 hour    Equipment Recommendations  Other (comment)(Defer to next venue)    Recommendations for Other Services PT consult    Precautions / Restrictions Precautions Precautions: Fall Restrictions Weight Bearing Restrictions: No       Mobility Bed Mobility Overal bed mobility: Needs Assistance Bed Mobility: Supine to Sit     Supine to sit: Min assist;HOB elevated     General bed mobility comments: Min A to elevate trunk and bring hips towards EOB  Transfers Overall transfer level: Needs assistance Equipment used: Rolling walker (2 wheeled) Transfers: Sit to/from Stand Sit to Stand: Min assist         General transfer comment: Min A for safety and balance; vc for safe use of RW/proper sequencing    Balance Overall balance assessment: Needs assistance Sitting-balance support: No upper extremity supported;Feet supported Sitting balance-Leahy Scale: Fair     Standing balance support: Bilateral upper extremity supported;During functional activity Standing balance-Leahy Scale: Poor Standing balance  comment: Reliant on physical A and UE support                           ADL either performed or assessed with clinical judgement   ADL Overall ADL's : Needs assistance/impaired                       Lower Body Dressing Details (indicate cue type and reason): Assistance to adjust socks Toilet Transfer: Minimal assistance;Ambulation;RW Toilet Transfer Details (indicate cue type and reason): Min A for power up into standing. Pt veryconcerned with returning to bed.          Functional mobility during ADLs: Minimal assistance;Rolling walker(short distance) General ADL Comments: Pt very concerned with return to bed. Min A for power up into stanidng and then maintaining balance during mobility forwards and backwards     Vision       Perception     Praxis      Cognition Arousal/Alertness: Awake/alert Behavior During Therapy: WFL for tasks assessed/performed Overall Cognitive Status: Difficult to assess                                 General Comments: Continues to follow commands during activity. Very HOH         Exercises     Shoulder Instructions       General Comments SpO2 in 90s on 6L O2 via HFNC.     Pertinent Vitals/ Pain       Pain Assessment: Faces Faces Pain Scale: No hurt Pain Intervention(s): Monitored during session  Home  Living                                          Prior Functioning/Environment              Frequency  Min 3X/week        Progress Toward Goals  OT Goals(current goals can now be found in the care plan section)  Progress towards OT goals: Progressing toward goals  Acute Rehab OT Goals Patient Stated Goal: "Sit in the chair" OT Goal Formulation: With patient Time For Goal Achievement: 07/29/19 Potential to Achieve Goals: Good ADL Goals Pt Will Perform Grooming: with min guard assist;standing Pt Will Perform Upper Body Dressing: with set-up;with supervision;sitting Pt  Will Perform Lower Body Dressing: with min guard assist;sit to/from stand Pt Will Transfer to Toilet: with min guard assist;bedside commode;ambulating Pt Will Perform Toileting - Clothing Manipulation and hygiene: with min guard assist;sit to/from stand;sitting/lateral leans Additional ADL Goal #1: Pt will use purse lip breathing during ADLs with Min cues  Plan Discharge plan remains appropriate    Co-evaluation                 AM-PAC OT "6 Clicks" Daily Activity     Outcome Measure   Help from another person eating meals?: None Help from another person taking care of personal grooming?: A Little Help from another person toileting, which includes using toliet, bedpan, or urinal?: A Little Help from another person bathing (including washing, rinsing, drying)?: A Lot Help from another person to put on and taking off regular upper body clothing?: A Little Help from another person to put on and taking off regular lower body clothing?: A Lot 6 Click Score: 17    End of Session Equipment Utilized During Treatment: Rolling walker;Oxygen(6L)  OT Visit Diagnosis: Unsteadiness on feet (R26.81);Other abnormalities of gait and mobility (R26.89);Muscle weakness (generalized) (M62.81)   Activity Tolerance Patient limited by fatigue   Patient Left in chair;with call bell/phone within reach;with chair alarm set   Nurse Communication Mobility status        Time: 3419-6222 OT Time Calculation (min): 15 min  Charges: OT General Charges $OT Visit: 1 Visit OT Treatments $Self Care/Home Management : 8-22 mins  [place], OTR/L Acute Rehab Pager: [phone] Office: [place] 07/16/2019, 4:49 PM

## 2019-07-16 NOTE — Progress Notes (Signed)
Physical Therapy Evaluation (late entry; note not populated 07/15/19)  Clinical impression: Patient extremely HOH which made communication very difficult. She demonstrated decr balance and poor use of RW and only tolerated/participated with bed to chair (refusing to attempt to walk--again communication was difficult). Normally she lives in independent living facility and walks with RW (although use of RW did not seem familiar to her during our session). At this time she needs 24/7 assistance and can benefit from further PT, therefore recommending SNF. See below for full details.    07/15/19 1451  PT Visit Information  Last PT Received On 07/15/19  Assistance Needed +1  PT/OT/SLP Co-Evaluation/Treatment Yes  Reason for Co-Treatment To address functional/ADL transfers  PT goals addressed during session Mobility/safety with mobility;Balance;Proper use of DME  History of Present Illness 85 y.o. female from [place] living facility with medical history significant for COPD, CAD, bipolar disorder,  who was sent to the ED at [facility] due to altered mental status, fever, tachycardia, tachypnea, +COVID Intubated 12/25-12/26; transfer out of ICU 07/15/19  Precautions  Precautions Fall  Restrictions  Weight Bearing Restrictions No  Home Living  Family/patient expects to be discharged to: Assisted living  [place] - 2 wheels  Additional Comments Pt reports that she lives in her house at [place].   Prior Function  Level of Independence Independent with assistive device(s)  Comments Pt reports that she uses RW for mobility and performs BADLs. Reports staff assist with IADLs. Inconsistent information obtained due to significant HOH with frequently changing her response/answer.   Communication  Communication HOH  Pain Assessment  Pain Assessment No/denies pain  Cognition  Arousal/Alertness Awake/alert  Behavior During Therapy WFL for tasks assessed/performed  Overall Cognitive  Status Difficult to assess  General Comments Pt following commands. However, very HOH and difficult to confirm PLOF and home information  Difficult to assess due to Hard of hearing/deaf  Upper Extremity Assessment  Upper Extremity Assessment Defer to OT evaluation  Lower Extremity Assessment  Lower Extremity Assessment Generalized weakness  Cervical / Trunk Assessment  Cervical / Trunk Assessment Kyphotic  Bed Mobility  Overal bed mobility Needs Assistance  Bed Mobility Supine to Sit  Supine to sit Min guard;HOB elevated  General bed mobility comments Min Guard A for safety. No physical A needed  Transfers  Overall transfer level Needs assistance  Equipment used Rolling walker (2 wheeled)  Transfers Sit to/from [employer] Transfers  Sit to Stand Min assist;+2 safety/equipment  Stand pivot transfers Min assist;+2 safety/equipment  General transfer comment Min A for safety and balance; vc for safe use of RW/proper sequencing  Ambulation/Gait  General Gait Details pivotal steps only; stated she only wanted to go to the chair  Balance  Overall balance assessment Needs assistance  Sitting-balance support No upper extremity supported;Feet supported  Sitting balance-Leahy Scale Fair  Standing balance support Bilateral upper extremity supported;During functional activity  Standing balance-Leahy Scale Poor  Standing balance comment Reliant on physical A and UE support  General Comments  General comments (skin integrity, edema, etc.) SpO2 in 90s on 6L via HFNC.   PT - End of Session  Equipment Utilized During Treatment Oxygen  Activity Tolerance Patient tolerated treatment well  Patient left in chair;with call bell/phone within reach;with chair alarm set (legs elevated due to edema)  Nurse Communication Mobility status  PT Assessment  PT Recommendation/Assessment Patient needs continued PT services  PT Visit Diagnosis Unsteadiness on feet (R26.81);Muscle weakness (generalized)  (M62.81);Difficulty in walking, not elsewhere  classified (R26.2)  PT Problem List Decreased strength;Decreased activity tolerance;Decreased balance;Decreased mobility;Decreased knowledge of use of DME;Decreased safety awareness;Decreased knowledge of precautions;Cardiopulmonary status limiting activity  Barriers to Discharge Decreased caregiver support  Barriers to Discharge Comments lives in ILF  PT Plan  PT Frequency (ACUTE ONLY) Min 2X/week  PT Treatment/Interventions (ACUTE ONLY) Gait training;DME instruction;Functional mobility training;Therapeutic activities;Therapeutic exercise;Balance training;Patient/family education  AM-PAC PT "6 Clicks" Mobility Outcome Measure (Version 2)  Help needed turning from your back to your side while in a flat bed without using bedrails? 3  Help needed moving from lying on your back to sitting on the side of a flat bed without using bedrails? 3  Help needed moving to and from a bed to a chair (including a wheelchair)? 3  Help needed standing up from a chair using your arms (e.g., wheelchair or bedside chair)? 3  Help needed to walk in hospital room? 2  Help needed climbing 3-5 steps with a railing?  1  6 Click Score 15  Consider Recommendation of Discharge To: CIR/SNF/LTACH  PT Recommendation  Follow Up Recommendations SNF;Supervision/Assistance - 24 hour  PT equipment None recommended by PT  Individuals Consulted  Consulted and Agree with Results and Recommendations Patient  Acute Rehab PT Goals  Patient Stated Goal "Sit in the chair"  PT Goal Formulation Patient unable to participate in goal setting  Time For Goal Achievement 07/29/19  Potential to Achieve Goals Fair  PT Time Calculation  PT Start Time (ACUTE ONLY) 1122  PT Stop Time (ACUTE ONLY) 1144  PT Time Calculation (min) (ACUTE ONLY) 22 min  PT General Charges  $$ ACUTE PT VISIT 1 Visit  PT Evaluation  $PT Eval Low Complexity 1 Low     Jerolyn Center, PT Pager [phone]

## 2019-07-16 NOTE — Plan of Care (Signed)
Pt A&Ox2 to person & place, disoriented to time & situation. Reoriented but easily forgets. VSS, SpO2 >88% on 6LNC. Chronic foley intact w/ adequate output, foley care performed. No c/o pain throughout shift.  Transferred well from bed to chair w/ 1A & FWW.   Daughter Annie Sable updated w/ plan of care [phone])  Will report to oncoming shift  Tomie China    Problem: Education: Goal: Knowledge of risk factors and measures for prevention of condition will improve Outcome: Progressing   Problem: Coping: Goal: Psychosocial and spiritual needs will be supported Outcome: Progressing   Problem: Respiratory: Goal: Will maintain a patent airway Outcome: Progressing Goal: Complications related to the disease process, condition or treatment will be avoided or minimized Outcome: Progressing

## 2019-07-16 NOTE — Progress Notes (Addendum)
PROGRESS NOTE    Isabella Campbell  EXN:[id number] DOB: 06-13-1918 DOA: 07/11/2019 PCP: Glenda Chroman, MD   Brief Narrative:  85 y.o. WF from [place] nursing facility PMHx bipolar 1 disorder, essential HTN, pulmonary HTN, CAD, COPD,  Sent to the [facility], ED due to altered mental status.  Baseline mental status unknown at this time due to patient being unable to provide history.  History was obtained from ED physician and ED chart.  Per report, patient was recently diagnosed with UTI and was started on Keflex.  ED Course: In the ED emergency room, patient was noted to be febrile with a temperature of 100.5, she was tachycardic and tachypneic with a BP of 175/84, O2 sats was 94% on supplemental oxygen via [place] at 2 LPM.  Work-up in the ED showed leukopenia, thrombocytopenia, hypoalbuminemia.  CRP was 4.9, D-dimer 1.46, fibrinogen 516, LDH 260, procalcitonin was negative, ferritin 230. POC SARS coronavirus 2 Ag test positive.  Chest x-ray shows COPD without acute airspace disease.  She was initially treated with IV ceftriaxone due to presumed CAP.  Hospitalist was asked to admit patient for further evaluation and management.   Subjective: 12/29 afebrile overnight A/O x4, negative abdominal pain, negative CP.  Positive S OB   .   Assessment & Plan:   Principal Problem:   COVID-19 virus infection Active Problems:   Essential hypertension   COPD (chronic obstructive pulmonary disease) (HCC)   Acute respiratory failure with hypoxia (HCC)   Altered mental status   Pressure injury of skin   Bipolar 1 disorder (HCC)  Covid pneumonia/acute respiratory failure with hypoxia COVID-19 Labs  Recent Labs    07/14/19 0453 07/15/19 0600 07/16/19 0230  DDIMER 1.01* 0.75* 0.75*  FERRITIN 377* 319* 278  CRP 6.8* 4.2* 2.3*    12/24 POC SARS coronavirus positive -Decadron 6 mg daily -Remdesivir per pharmacy protocol -Combivent QID -Vitamins per Covid protocol -Titrate O2 to maintain  SPO2> 88% -Prone patient 16 hours/day; if cannot tolerate 2 to 3 hours per shift -Out of bed to chair q shift  Sepsis/aspiration pneumonia -Patient initially met criteria for SIRS, tachycardic, tachypneic, leukopenic and febrile. -CXR s/p intubation showed increased airspace opacity in the right lung concerning for aspiration pneumonia -Initially started on empiric antibiotics.  Have been discontinued.   -Continue to trend procalcitonin restart if procalcitonin trends up, leukocytosis, fever.  Leukopenia/ Thrombocytopenia/lymphopenia possibly secondary to above vs reactive -Resolved  Altered Mental Status  -Unknown baseline.  Today only A/O x3 (does not know where) with perseveration -May be some hospital delirium, baseline dementia however it does not improve by the a.m. Will obtain head CT  Bipolar 1 disorder -12/28 restarted bipolar medication  Essential hypertension -12/28 Atenolol 25 mg daily (home dose) -12/28 change scheduled hydralazine to PRN  History of COPD  -See Covid pneumonia  Dysphagia -N.p.o. until swallow evaluation -12/27 swallow eval pending  Hypomagnesmia -Magnesium goal> 2   Goals of care -12/29 PT/OT; evaluate for SNF (what level in nursing facility should she return to?)    DVT prophylaxis: Lovenox Code Status: DNR Family Communication: 12/29 spoke at length with Thayer Headings (daughter) discussed plan of care answered all questions Disposition Plan:    Consultants:  PCCM  Procedures/Significant Events:     I have personally reviewed and interpreted all radiology studies and my findings are as above.  VENTILATOR SETTINGS: HFNC 12/29 Flow rate; 6 L/min SPO2 96%    Cultures   Antimicrobials: Anti-infectives (From admission, onward)   Start  Dose/Rate Stop   07/13/19 1000  remdesivir 100 mg in sodium chloride 0.9 % 100 mL IVPB     100 mg 200 mL/hr over 30 Minutes 07/17/19 0959   07/12/19 2000  cefTRIAXone (ROCEPHIN) 1 g in sodium  chloride 0.9 % 100 mL IVPB  Status:  Discontinued     1 g 200 mL/hr over 30 Minutes 07/13/19 1423   07/12/19 0600  clindamycin (CLEOCIN) IVPB 600 mg  Status:  Discontinued     600 mg 100 mL/hr over 30 Minutes 07/12/19 0930   07/12/19 0230  remdesivir 200 mg in sodium chloride 0.9% 250 mL IVPB     200 mg 580 mL/hr over 30 Minutes 07/12/19 0714   07/11/19 2000  azithromycin (ZITHROMAX) 500 mg in sodium chloride 0.9 % 250 mL IVPB  Status:  Discontinued     500 mg 250 mL/hr over 60 Minutes 07/13/19 1423   07/11/19 1900  cefTRIAXone (ROCEPHIN) 2 g in sodium chloride 0.9 % 100 mL IVPB     2 g 200 mL/hr over 30 Minutes 07/11/19 2025       Devices     LINES / TUBES:      Continuous Infusions: . phenylephrine (NEO-SYNEPHRINE) Adult infusion Stopped (07/13/19 0331)  . remdesivir 100 mg in NS 100 mL 100 mg (07/15/19 1042)     Objective: Vitals:   07/16/19 0300 07/16/19 0400 07/16/19 0759 07/16/19 0808  BP:  (!) 149/72  (!) 145/79  Pulse: (!) 59 76  83  Resp: (!) 24 (!) 23    Temp:  98.8 F (37.1 C) 98.2 F (36.8 C)   TempSrc:  Axillary Oral   SpO2: 94% 94%    Weight:      Height:        Intake/Output Summary (Last 24 hours) at 07/16/2019 0848 Last data filed at 07/16/2019 0500 Gross per 24 hour  Intake 560 ml  Output 720 ml  Net -160 ml   Filed Weights   07/12/19 0320  Weight: 82.1 kg    Physical Exam:  General: A/O x4, positive acute respiratory distress Eyes: negative scleral hemorrhage, negative anisocoria, negative icterus ENT: Negative Runny nose, negative gingival bleeding, Neck:  Negative scars, masses, torticollis, lymphadenopathy, JVD Lungs: Tachypneic, clear to auscultation bilaterally without wheezes or crackles Cardiovascular: Regular rate and rhythm without murmur gallop or rub normal S1 and S2 Abdomen: negative abdominal pain, nondistended, positive soft, bowel sounds, no rebound, no ascites, no appreciable mass Extremities: No significant  cyanosis, clubbing, or edema bilateral lower extremities Skin: Negative rashes, lesions, ulcers Psychiatric:  Negative depression, negative anxiety, negative fatigue, negative mania  Central nervous system:  Cranial nerves II through XII intact, tongue/uvula midline, all extremities muscle strength 5/5, sensation intact throughout, negative dysarthria, negative expressive aphasia, negative receptive aphasia..     Data Reviewed: Care during the described time interval was provided by me .  I have reviewed this patient's available data, including medical history, events of note, physical examination, and all test results as part of my evaluation.   CBC: Recent Labs  Lab 07/11/19 1908 07/13/19 0200 07/14/19 0453 07/15/19 0600 07/16/19 0230  WBC 2.6* 5.0 9.2 4.7 5.1  NEUTROABS 1.8 3.9 7.6 3.6 3.8  HGB 12.5 13.4 14.7 13.7 12.4  HCT 39.2 41.4 44.0 42.9 38.0  MCV 94.7 92.8 90.2 93.1 92.0  PLT 115* 157 264 275 785   Basic Metabolic Panel: Recent Labs  Lab 07/11/19 1908 07/13/19 0200 07/14/19 0453 07/15/19 0600 07/16/19 0230  NA  133* 139 139 142 142  K 4.1 4.8 4.0 4.8 4.3  CL 100 103 104 106 108  CO2 '25 25 25 23 27  '$ GLUCOSE 143* 109* 149* 182* 168*  BUN 21 27* 31* 38* 41*  CREATININE 1.03* 1.05* 1.05* 0.82 0.73  CALCIUM 8.2* 8.0* 8.3* 8.2* 8.3*  MG  --  1.6* 1.6* 2.1 2.1  PHOS  --  4.1 3.8 3.3 3.1   GFR: Estimated Creatinine Clearance: 40.5 mL/min (by C-G formula based on SCr of 0.73 mg/dL). Liver Function Tests: Recent Labs  Lab 07/11/19 1908 07/13/19 0200 07/14/19 0453 07/15/19 0600 07/16/19 0230  AST 31 37 44* 54* 78*  ALT '13 13 17 25 '$ 45*  ALKPHOS 49 45 49 51 49  BILITOT 0.5 0.2* 0.5 0.6 0.9  PROT 6.2* 5.5* 5.9* 5.6* 5.3*  ALBUMIN 2.9* 2.4* 2.6* 2.5* 2.6*   No results for input(s): LIPASE, AMYLASE in the last 168 hours. No results for input(s): AMMONIA in the last 168 hours. Coagulation Profile: Recent Labs  Lab 07/11/19 1908  INR 1.1   Cardiac  Enzymes: No results for input(s): CKTOTAL, CKMB, CKMBINDEX, TROPONINI in the last 168 hours. BNP (last 3 results) No results for input(s): PROBNP in the last 8760 hours. HbA1C: No results for input(s): HGBA1C in the last 72 hours. CBG: Recent Labs  Lab 07/14/19 0733  GLUCAP 131*   Lipid Profile: No results for input(s): CHOL, HDL, LDLCALC, TRIG, CHOLHDL, LDLDIRECT in the last 72 hours. Thyroid Function Tests: No results for input(s): TSH, T4TOTAL, FREET4, T3FREE, THYROIDAB in the last 72 hours. Anemia Panel: Recent Labs    07/15/19 0600 07/16/19 0230  FERRITIN 319* 278   Urine analysis: No results found for: COLORURINE, APPEARANCEUR, LABSPEC, PHURINE, GLUCOSEU, HGBUR, BILIRUBINUR, KETONESUR, PROTEINUR, UROBILINOGEN, NITRITE, LEUKOCYTESUR Sepsis Labs: '@LABRCNTIP'$ (procalcitonin:4,lacticidven:4)  ) Recent Results (from the past 240 hour(s))  Blood Culture (routine x 2)     Status: None   Collection Time: 07/11/19  7:08 PM   Specimen: Right Antecubital; Blood  Result Value Ref Range Status   Specimen Description RIGHT ANTECUBITAL  Final   Special Requests   Final    BOTTLES DRAWN AEROBIC AND ANAEROBIC Blood Culture adequate volume   Culture   Final    NO GROWTH 5 DAYS Performed at [facility], [address] [place], [place] [address]    Report Status 07/16/2019 FINAL  Final  Blood Culture (routine x 2)     Status: None   Collection Time: 07/11/19  7:08 PM   Specimen: Left Antecubital; Blood  Result Value Ref Range Status   Specimen Description LEFT ANTECUBITAL  Final   Special Requests   Final    BOTTLES DRAWN AEROBIC AND ANAEROBIC Blood Culture adequate volume   Culture   Final    NO GROWTH 5 DAYS Performed at [facility], [address] [place], [place] [address]    Report Status 07/16/2019 FINAL  Final  MRSA PCR Screening     Status: None   Collection Time: 07/12/19  5:51 AM   Specimen: Nasopharyngeal  Result Value Ref Range Status   MRSA by PCR NEGATIVE  NEGATIVE Final    Comment:        The GeneXpert MRSA Assay (FDA approved for NASAL specimens only), is one component of a comprehensive MRSA colonization surveillance program. It is not intended to diagnose MRSA infection nor to guide or monitor treatment for MRSA infections. Performed at [facility], [place] [address], [place], [place] [address]  Radiology Studies: No results found.      Scheduled Meds: . vitamin C  500 mg Oral Daily  . atenolol  25 mg Oral Daily  . benztropine  0.5 mg Oral Daily  . chlorhexidine  15 mL Mouth Rinse BID  . Chlorhexidine Gluconate Cloth  6 each Topical Daily  . dexamethasone (DECADRON) injection  6 mg Intravenous QHS  . dextromethorphan-guaiFENesin  1 tablet Oral BID  . divalproex  250 mg Oral BID  . donepezil  5 mg Oral QHS  . enoxaparin (LOVENOX) injection  40 mg Subcutaneous Q12H  . famotidine  20 mg Oral BID  . Ipratropium-Albuterol  1 puff Inhalation QID  . mouth rinse  15 mL Mouth Rinse q12n4p  . risperiDONE  1 mg Oral QHS  . zinc sulfate  220 mg Oral Daily   Continuous Infusions: . phenylephrine (NEO-SYNEPHRINE) Adult infusion Stopped (07/13/19 0331)  . remdesivir 100 mg in NS 100 mL 100 mg (07/15/19 1042)     LOS: 5 days   The patient is critically ill with multiple organ systems failure and requires high complexity decision making for assessment and support, frequent evaluation and titration of therapies, application of advanced monitoring technologies and extensive interpretation of multiple databases. Critical Care Time devoted to patient care services described in this note  Time spent: 40 minutes     Azyiah Bo, Geraldo Docker, MD [facility] Pager [phone]  If 7PM-7AM, please contact night-coverage [email or web address] Password [facility] 07/16/2019, 8:48 AM

## 2019-07-17 LAB — CBC WITH DIFFERENTIAL/PLATELET
Abs Immature Granulocytes: 0.32 10*3/uL — ABNORMAL HIGH (ref 0.00–0.07)
Basophils Absolute: 0.1 10*3/uL (ref 0.0–0.1)
Basophils Relative: 2 %
Eosinophils Absolute: 0 10*3/uL (ref 0.0–0.5)
Eosinophils Relative: 0 %
HCT: 38.6 % (ref 36.0–46.0)
Hemoglobin: 12.5 g/dL (ref 12.0–15.0)
Immature Granulocytes: 5 %
Lymphocytes Relative: 15 %
Lymphs Abs: 0.9 10*3/uL (ref 0.7–4.0)
MCH: 30 pg (ref 26.0–34.0)
MCHC: 32.4 g/dL (ref 30.0–36.0)
MCV: 92.8 fL (ref 80.0–100.0)
Monocytes Absolute: 0.4 10*3/uL (ref 0.1–1.0)
Monocytes Relative: 6 %
Neutro Abs: 4.3 10*3/uL (ref 1.7–7.7)
Neutrophils Relative %: 72 %
Platelets: 312 10*3/uL (ref 150–400)
RBC: 4.16 MIL/uL (ref 3.87–5.11)
RDW: 13.3 % (ref 11.5–15.5)
WBC: 5.9 10*3/uL (ref 4.0–10.5)
nRBC: 0 % (ref 0.0–0.2)

## 2019-07-17 LAB — MAGNESIUM: Magnesium: 2 mg/dL (ref 1.7–2.4)

## 2019-07-17 LAB — COMPREHENSIVE METABOLIC PANEL
ALT: 47 U/L — ABNORMAL HIGH (ref 0–44)
AST: 59 U/L — ABNORMAL HIGH (ref 15–41)
Albumin: 2.5 g/dL — ABNORMAL LOW (ref 3.5–5.0)
Alkaline Phosphatase: 45 U/L (ref 38–126)
Anion gap: 9 (ref 5–15)
BUN: 41 mg/dL — ABNORMAL HIGH (ref 8–23)
CO2: 25 mmol/L (ref 22–32)
Calcium: 8.4 mg/dL — ABNORMAL LOW (ref 8.9–10.3)
Chloride: 108 mmol/L (ref 98–111)
Creatinine, Ser: 0.84 mg/dL (ref 0.44–1.00)
GFR calc Af Amer: >60 mL/min (ref >60)
GFR calc non Af Amer: 57 mL/min — ABNORMAL LOW (ref >60)
Glucose, Bld: 192 mg/dL — ABNORMAL HIGH (ref 70–99)
Potassium: 4.7 mmol/L (ref 3.5–5.1)
Sodium: 142 mmol/L (ref 135–145)
Total Bilirubin: 0.7 mg/dL (ref 0.3–1.2)
Total Protein: 5.2 g/dL — ABNORMAL LOW (ref 6.5–8.1)

## 2019-07-17 LAB — FERRITIN: Ferritin: 256 ng/mL (ref 11–307)

## 2019-07-17 LAB — PHOSPHORUS: Phosphorus: 3.1 mg/dL (ref 2.5–4.6)

## 2019-07-17 LAB — D-DIMER, QUANTITATIVE: D-Dimer, Quant: 0.71 ug/mL-FEU — ABNORMAL HIGH (ref 0.00–0.50)

## 2019-07-17 LAB — C-REACTIVE PROTEIN: CRP: 1.6 mg/dL — ABNORMAL HIGH (ref <1.0)

## 2019-07-17 NOTE — Progress Notes (Signed)
Physical Therapy Treatment Patient Details Name: Isabella Campbell MRN: [id number] DOB: 1917-09-15 Today's Date: 07/17/2019    History of Present Illness 85 y.o. female from [facility] living facility with medical history significant for COPD, CAD, bipolar disorder,  who was sent to the ED at [facility] due to altered mental status, fever, tachycardia, tachypnea, +COVID Intubated 12/25-12/26; transfer out of ICU 07/15/19    PT Comments    Pt was agreeable to OOB to the chair and was able to complete seated chair exercises and a round of standing marches with the RW.  I attempted to test her on RA, however, she dropped to 87% quickly with increased DOE while mobilizing.  She was able to maintain in the low 90s on only 2 L O2 [place], so I left her on 2 L sitting up in the chair at the end of the session.  She does not have flutter valve or IS ordered.  PT will continue to follow acutely for safe mobility progression  Follow Up Recommendations  SNF;Supervision/Assistance - 24 hour     Equipment Recommendations  Other (comment)(possible post acute O2)    Recommendations for Other Services   NA     Precautions / Restrictions Precautions Precautions: Fall;Other (comment) Precaution Comments: extremely HOH    Mobility  Bed Mobility Overal bed mobility: Needs Assistance Bed Mobility: Supine to Sit     Supine to sit: Min assist;HOB elevated     General bed mobility comments: Min hand held assist using railing and HOB elevated ~45 degrees.  Pt pulling on bed rail to support trunk during transitions.   Transfers Overall transfer level: Needs assistance Equipment used: Rolling walker (2 wheeled) Transfers: Sit to/from [facility] Sit to Stand: Min assist;Mod assist Stand pivot transfers: Min assist       General transfer comment: Min assist to stand from elevated bed, mod assist to later stand from lower recliner chair.  Pt min assist to transfer from bed to recliner  just beside the bed.   Ambulation/Gait             General Gait Details: Pt declined offers to help her ambulate.  She did agree to march in place.        Balance Overall balance assessment: Needs assistance Sitting-balance support: Feet supported;Bilateral upper extremity supported Sitting balance-Leahy Scale: Fair     Standing balance support: Bilateral upper extremity supported Standing balance-Leahy Scale: Poor Standing balance comment: needs external support from RW and therapist.                             Cognition Arousal/Alertness: Awake/alert Behavior During Therapy: WFL for tasks assessed/performed Overall Cognitive Status: Difficult to assess                                 General Comments: Difficult to assess due to very HOH, hard to tell what is hearing and what is cognition.       Exercises General Exercises - Upper Extremity Shoulder Flexion: AAROM;Both;10 reps(inhale with flexion exhale with lowering) General Exercises - Lower Extremity Ankle Circles/Pumps: AROM;Both;10 reps Long Arc Quad: AROM;Both;10 reps Hip Flexion/Marching: AROM;Both;10 reps    General Comments General comments (skin integrity, edema, etc.): O2 turned off (was on 5L O2 [place] at rest) to test ambulatory sats.  She quickly decreased to 87% with mobility on RA with increased DOE  2/4.  She was able to maintain in the low 90s on only 2L O2 [place], so I left her turned down.        Pertinent Vitals/Pain Pain Assessment: No/denies pain           PT Goals (current goals can now be found in the care plan section) Acute Rehab PT Goals Patient Stated Goal: "Sit in the chair" Progress towards PT goals: Progressing toward goals    Frequency    Min 2X/week      PT Plan Current plan remains appropriate       AM-PAC PT "6 Clicks" Mobility   Outcome Measure  Help needed turning from your back to your side while in a flat bed without using bedrails?: A  Little Help needed moving from lying on your back to sitting on the side of a flat bed without using bedrails?: A Little Help needed moving to and from a bed to a chair (including a wheelchair)?: A Little Help needed standing up from a chair using your arms (e.g., wheelchair or bedside chair)?: A Lot Help needed to walk in hospital room?: A Lot Help needed climbing 3-5 steps with a railing? : Total 6 Click Score: 14    End of Session Equipment Utilized During Treatment: Oxygen(2-5L) Activity Tolerance: Patient limited by fatigue Patient left: in chair;with call bell/phone within reach;with chair alarm set Nurse Communication: Mobility status PT Visit Diagnosis: Unsteadiness on feet (R26.81);Muscle weakness (generalized) (M62.81);Difficulty in walking, not elsewhere classified (R26.2)     Time: 3474-2595 PT Time Calculation (min) (ACUTE ONLY): 28 min  Charges:  $Therapeutic Exercise: 8-22 mins $Therapeutic Activity: 8-22 mins                    Corinna Capra, PT, DPT  Acute Rehabilitation [phone] pager #[phone] office  @ [place] [place]: [phone]   07/17/2019, 5:16 PM

## 2019-07-17 NOTE — TOC Progression Note (Signed)
Isabella Campbell, Isabella Campbell (CSN: [id number]) (84 y.o. F) (Adm: 07/11/19) [id number] PCP  VYAS, [place] Demographics Comment   Last edited by  on at   Address: Home Phone: Work Designer, industrial/product: Mobile Phone:  [place]  [place] [address]   [phone] -- --  SSN: Insurance: Marital Status: Religion:  [id number] MEDICARE Widowed None  Patient Ethnicity & Race  Ethnic Group Patient Race  Not Hispanic or Latino White or Caucasian  Documents Filed to Patient  Power of Attorney Living Will Clinical Unknown Study Attachment Consent Form ABN Waiver After Visit Summary Lab Result Scan Code Status MyChart Status Advance Care Planning  Not on File Not on File Not on File Not on File Filed Not on [place] DNR [Updated on 07/12/19 0926] Code Exp Jump to the Activity  Admission Information  Current Information  Attending Provider Admitting Provider Admission Type Admission Status  Allie Bossier, MD Bernadette Hoit, DO Emergency Admission (Confirmed)       Admission Date/Time Discharge Date Hospital Service Auth/Cert Status  74/08/14 06:33 PM  [facility] Unit Room/Bed   [place] A 9162/9162-01        Hospital Account  Name Acct ID Class Status Primary Coverage  [place], [place] [id number] Inpatient Open MEDICARE - MEDICARE PART A AND B      Guarantor Account (for [place] [id number])  Name Relation to Pt Service Area Active? Acct Type  [place], [place] Yes Personal/Family  Address Phone    [address]  [place], [place] [address] [phone])        Coverage Information (for Hospital Account [id number])  1. [place] PART A AND B  F/O Payor/Plan Precert #  MEDICARE/MEDICARE PART A AND B   Subscriber Subscriber #  Alexandra, [place] [id number]  Address Phone  [address] [place], [place] [address]   2. BLUE CROSS BLUE SHIELD/BCBS/FEDERAL EMP PPO  F/O  Payor/Plan Precert #  BLUE CROSS BLUE SHIELD/BCBS/FEDERAL EMP PPO   Subscriber Subscriber #  Claudene, [place] [id number]  Address Phone  [address] [place], [place] [address] [phone]       Care Everywhere ID:  [phone]

## 2019-07-17 NOTE — TOC Progression Note (Signed)
Transition of Care [facility]) - Progression Note    Patient Details  Name: Isabella Campbell MRN: [id number] Date of Birth: 30-Jul-1917  Transition of [facility]) CM/SW Contact  Shade Flood, LCSW Phone Number: 07/17/2019, 3:53 PM  Clinical Narrative:     Pt needing SNF rehab at dc. Per MD, pt medically stable. Spoke with pt's daughter to update. Daughter prefers [facility] but she verbalizes understanding that bed search will likely be expansive. She is agreeable to referrals wherever needed.  Have referred to multiple facilities. Have been told by [facility], [facility], [facility], and [facility] that they do not have bed availability at this time. Santiago Glad at [facility] in [place] states that they don't currently have a bed but they might in a couple of days. Amber at [facility] in [place] says they do have beds available. Referrals sent to all of the above facilities. Anticipating return call from [facility] with determination. This may be pt's only option. Spoke to Brogden at [facility] in [place] who stated that they do not have a COIVD unit any longer. Referral also sent to [facility] in [place] by fax. Could not reach the admissions coordinator and voice mail box could not accept a message.   Also working on [facility]. She is forwarded to the level II screening process. Will need to ask accepting facility once one is identified if they will take pt while the PASRR number is pending since the process is started and the PASRR requirement is technically waived right now. All the documentation has already been provided to [facility] for the determination.  Updated MD. Covering TOC will follow up tomorrow.  Expected Discharge Plan: [place] Barriers to Discharge: Continued Medical Work up  Expected Discharge Plan and Services Expected Discharge Plan: [place] In-house Referral: Clinical Social Work     Living arrangements for the past  2 months: [place]                                       Social Determinants of Health (SDOH) Interventions    Readmission Risk Interventions Readmission Risk Prevention Plan 07/15/2019  Transportation Screening Complete  Medication Review (RN CM) Complete  Some recent data might be hidden

## 2019-07-17 NOTE — TOC Progression Note (Signed)
Transition of Care [facility]) - Progression Note    Patient Details  Name: ALLYNA PITTSLEY MRN: [id number] Date of Birth: 19-Oct-1917  [facility]) CM/SW Contact  Shade Flood, LCSW Phone Number: 07/17/2019, 4:58 PM  Clinical Narrative:     Received return call from [facility] in [place] and they are making a bed offer to pt. They will also accept her with the PASRR level II determination pending.   Updated pt's daughter who is agreeable to this placement however, she asks that we make one more call to [facility] in the AM to see if anything opened up there. If not, she will accept the [facility].  Covering TOC will follow up in the AM to further assist.   Expected Discharge Plan: [place] Barriers to Discharge: Continued Medical Work up  Expected Discharge Plan and Services Expected Discharge Plan: [place] In-house Referral: Clinical Social Work     Living arrangements for the past 2 months: [place]                                       Social Determinants of Health (SDOH) Interventions    Readmission Risk Interventions Readmission Risk Prevention Plan 07/15/2019  Transportation Screening Complete  Medication Review (RN CM) Complete  Some recent data might be hidden

## 2019-07-17 NOTE — NC FL2 (Signed)
[place] LEVEL OF CARE SCREENING TOOL     IDENTIFICATION  Patient Name: Isabella Campbell Birthdate: 05/18/1918 Sex: female Admission Date (Current Location): 07/11/2019  [facility] and [place] Number:  Herbalist and Address:  [facility] [place] [address] [place], [place] [address]      Provider Number: [id number]  Attending Physician Name and Address:  Allie Bossier, MD  Relative Name and Phone Number:       Current Level of Care: Hospital Recommended Level of Care: [place] Prior Approval Number:    Date Approved/Denied:   PASRR Number:    Discharge Plan: SNF    Current Diagnoses: Patient Active Problem List   Diagnosis Date Noted  . Bipolar 1 disorder ([place]) 07/15/2019  . Acute respiratory failure with hypoxia ([place]) 07/12/2019  . Altered mental status 07/12/2019  . Pressure injury of skin 07/12/2019  . COVID-19 virus infection 07/11/2019  . Guaiac positive stools 08/17/2016  . DYSLIPIDEMIA 12/09/2009  . CAROTID ARTERY DISEASE 05/06/2009  . COPD (chronic obstructive pulmonary disease) ([place]) 05/06/2009  . MURMUR 05/06/2009  . Essential hypertension 04/15/2009  . PULMONARY HYPERTENSION 04/15/2009    Orientation RESPIRATION BLADDER Height & Weight     Self, Place  O2 Incontinent Weight: 181 lb (82.1 kg) Height:  5\' 5"  (165.1 cm)  BEHAVIORAL SYMPTOMS/MOOD NEUROLOGICAL BOWEL NUTRITION STATUS      Continent Diet(see dc summary)  AMBULATORY STATUS COMMUNICATION OF NEEDS Skin   Extensive Assist Verbally PU Stage and Appropriate Care([place]) PU Stage 1 Dressing: (foam, change every third day)                     Personal Care Assistance Level of Assistance  Bathing, Feeding, Dressing Bathing Assistance: Limited assistance Feeding assistance: Independent Dressing Assistance: Limited assistance     Functional Limitations Info  Sight, Hearing, Speech Sight Info: Adequate Hearing Info:  Adequate Speech Info: Adequate    SPECIAL CARE FACTORS FREQUENCY  PT (By licensed PT), OT (By licensed OT)     PT Frequency: 5 times week OT Frequency: 3 times week            Contractures Contractures Info: Not present    Additional Factors Info  Code Status, Allergies, Psychotropic Code Status Info: DNR Allergies Info: Doxycycline, Penicillins, Quetiapine, Sulfonamide Derivatives Psychotropic Info: Depakote, Neurontin, Risperdal         Current Medications (07/17/2019):  This is the current hospital active medication list Current Facility-Administered Medications  Medication Dose Route Frequency Provider Last Rate Last Admin  . acetaminophen (TYLENOL) tablet 650 mg  650 mg Oral Q6H PRN Adefeso, Oladapo, DO   650 mg at 07/13/19 2204  . ascorbic acid (VITAMIN C) tablet 500 mg  500 mg Oral Daily Adefeso, Oladapo, DO   500 mg at 07/16/19 0809  . atenolol (TENORMIN) tablet 25 mg  25 mg Oral Daily Allie Bossier, MD   25 mg at 07/16/19 0809  . benztropine (COGENTIN) tablet 0.5 mg  0.5 mg Oral Daily Allie Bossier, MD   0.5 mg at 07/16/19 0810  . chlorhexidine (PERIDEX) 0.12 % solution 15 mL  15 mL Mouth Rinse BID Adefeso, Oladapo, DO   15 mL at 07/16/19 2054  . Chlorhexidine Gluconate Cloth 2 % PADS 6 each  6 each Topical Daily Adefeso, Oladapo, DO   6 each at 07/16/19 1435  . chlorpheniramine-HYDROcodone (TUSSIONEX) 10-8 MG/5ML suspension 5 mL  5 mL Oral Q12H  PRN Frankey Shown, DO      . dexamethasone (DECADRON) injection 6 mg  6 mg Intravenous QHS Adefeso, Oladapo, DO   6 mg at 07/16/19 2057  . dextromethorphan-guaiFENesin (MUCINEX DM) 30-600 MG per 12 hr tablet 1 tablet  1 tablet Oral BID Adefeso, Oladapo, DO   1 tablet at 07/16/19 2055  . divalproex (DEPAKOTE) DR tablet 250 mg  250 mg Oral BID Drema Dallas, MD   250 mg at 07/16/19 2057  . donepezil (ARICEPT) tablet 5 mg  5 mg Oral QHS Drema Dallas, MD   5 mg at 07/16/19 2055  . enoxaparin (LOVENOX) injection 40 mg   40 mg Subcutaneous Q12H Max Fickle B, MD   40 mg at 07/17/19 0201  . famotidine (PEPCID) 40 MG/5ML suspension 20 mg  20 mg Oral BID Oretha Milch, MD   20 mg at 07/16/19 2053  . guaiFENesin-dextromethorphan (ROBITUSSIN DM) 100-10 MG/5ML syrup 10 mL  10 mL Oral Q4H PRN Adefeso, Oladapo, DO      . hydrALAZINE (APRESOLINE) injection 5 mg  5 mg Intravenous Q6H PRN Drema Dallas, MD      . Ipratropium-Albuterol (COMBIVENT) respimat 1 puff  1 puff Inhalation QID Drema Dallas, MD   1 puff at 07/16/19 1937  . MEDLINE mouth rinse  15 mL Mouth Rinse q12n4p Adefeso, Oladapo, DO   15 mL at 07/16/19 1739  . phenylephrine (NEOSYNEPHRINE) 10-0.9 MG/250ML-% infusion  0-400 mcg/min Intravenous Titrated Lupita Leash, MD   Stopped at 07/13/19 [phone]  . risperiDONE (RISPERDAL) tablet 1 mg  1 mg Oral QHS Drema Dallas, MD   1 mg at 07/16/19 2056  . traZODone (DESYREL) tablet 50 mg  50 mg Oral QHS PRN Max Fickle B, MD      . zinc sulfate capsule 220 mg  220 mg Oral Daily Adefeso, Oladapo, DO   220 mg at 07/16/19 0808     Discharge Medications: Please see discharge summary for a list of discharge medications.  Relevant Imaging Results:  Relevant Lab Results:   Additional Information SSN: [id number]  [name], LCSW

## 2019-07-17 NOTE — Progress Notes (Signed)
PROGRESS NOTE    Isabella Campbell  INO:[id number] DOB: 07/21/1917 DOA: 07/11/2019 PCP: Glenda Chroman, MD   Brief Narrative:  85 y.o. WF from [place] nursing facility PMHx bipolar 1 disorder, essential HTN, pulmonary HTN, CAD, COPD,  Sent to the [facility], ED due to altered mental status.  Baseline mental status unknown at this time due to patient being unable to provide history.  History was obtained from ED physician and ED chart.  Per report, patient was recently diagnosed with UTI and was started on Keflex.  ED Course: In the ED emergency room, patient was noted to be febrile with a temperature of 100.5, she was tachycardic and tachypneic with a BP of 175/84, O2 sats was 94% on supplemental oxygen via [place] at 2 LPM.  Work-up in the ED showed leukopenia, thrombocytopenia, hypoalbuminemia.  CRP was 4.9, D-dimer 1.46, fibrinogen 516, LDH 260, procalcitonin was negative, ferritin 230. POC SARS coronavirus 2 Ag test positive.  Chest x-ray shows COPD without acute airspace disease.  She was initially treated with IV ceftriaxone due to presumed CAP.  Hospitalist was asked to admit patient for further evaluation and management.   Subjective: 12/30 afebrile overnight./O x4.  Some perseveration, negative abdominal pain.  Positive S OB     Assessment & Plan:   Principal Problem:   COVID-19 virus infection Active Problems:   Essential hypertension   COPD (chronic obstructive pulmonary disease) ([place])   Acute respiratory failure with hypoxia (HCC)   Altered mental status   Pressure injury of skin   Bipolar 1 disorder (HCC)  Covid pneumonia/acute respiratory failure with hypoxia COVID-19 Labs  Recent Labs    07/15/19 0600 07/16/19 0230 07/17/19 0120  DDIMER 0.75* 0.75* 0.71*  FERRITIN 319* 278 256  CRP 4.2* 2.3* 1.6*    12/24 POC SARS coronavirus positive -Decadron 6 mg daily -Remdesivir per pharmacy protocol -Combivent QID -Vitamins per Covid protocol -Titrate O2 to maintain  SPO2> 88% -Prone patient 16 hours/day; if cannot tolerate 2 to 3 hours per shift -Out of bed to chair q shift  Sepsis/aspiration pneumonia -Patient initially met criteria for SIRS, tachycardic, tachypneic, leukopenic and febrile. -CXR s/p intubation showed increased airspace opacity in the right lung concerning for aspiration pneumonia -Initially started on empiric antibiotics.  Have been discontinued.   -Continue to trend procalcitonin restart if procalcitonin trends up, leukocytosis, fever.  Leukopenia/ Thrombocytopenia/lymphopenia possibly secondary to above vs reactive -Resolved  Altered Mental Status  -Unknown baseline.  A/O x4 with mild perseveration -Initially thought hospital delirium however patient had not been receiving her bipolar medication has improved significantly since meds restarted.  Bipolar 1 disorder -12/28 restarted bipolar medication  Essential hypertension -12/28 Atenolol 25 mg daily (home dose) -12/28 change scheduled hydralazine to PRN  History of COPD  -See Covid pneumonia  Dysphagia -N.p.o. until swallow evaluation -12/27 swallow eval pending  Hypomagnesmia -Magnesium goal> 2   Goals of care -12/29 PT/OT; evaluate for SNF (what level in nursing facility should she return to?)    DVT prophylaxis: Lovenox Code Status: DNR Family Communication: 12/29 spoke at length with Thayer Headings (daughter) discussed plan of care answered all questions Disposition Plan: SNF   Consultants:  PCCM  Procedures/Significant Events:     I have personally reviewed and interpreted all radiology studies and my findings are as above.  VENTILATOR SETTINGS: HFNC 12/30  flow rate; 3 L/min SPO2 93%    Cultures   Antimicrobials: Anti-infectives (From admission, onward)   Start     Dose/Rate  Stop   07/13/19 1000  remdesivir 100 mg in sodium chloride 0.9 % 100 mL IVPB     100 mg 200 mL/hr over 30 Minutes 07/17/19 0959   07/12/19 2000  cefTRIAXone (ROCEPHIN)  1 g in sodium chloride 0.9 % 100 mL IVPB  Status:  Discontinued     1 g 200 mL/hr over 30 Minutes 07/13/19 1423   07/12/19 0600  clindamycin (CLEOCIN) IVPB 600 mg  Status:  Discontinued     600 mg 100 mL/hr over 30 Minutes 07/12/19 0930   07/12/19 0230  remdesivir 200 mg in sodium chloride 0.9% 250 mL IVPB     200 mg 580 mL/hr over 30 Minutes 07/12/19 0714   07/11/19 2000  azithromycin (ZITHROMAX) 500 mg in sodium chloride 0.9 % 250 mL IVPB  Status:  Discontinued     500 mg 250 mL/hr over 60 Minutes 07/13/19 1423   07/11/19 1900  cefTRIAXone (ROCEPHIN) 2 g in sodium chloride 0.9 % 100 mL IVPB     2 g 200 mL/hr over 30 Minutes 07/11/19 2025       Devices     LINES / TUBES:      Continuous Infusions: . phenylephrine (NEO-SYNEPHRINE) Adult infusion Stopped (07/13/19 0331)     Objective: Vitals:   07/17/19 0300 07/17/19 0400 07/17/19 0500 07/17/19 0742  BP:  130/64  (!) 143/69  Pulse: 92 81 67 88  Resp: (!) 27 (!) 23 (!) 22 18  Temp:  98 F (36.7 C)  98.7 F (37.1 C)  TempSrc:  Oral  Oral  SpO2: 95% 92% 96% 94%  Weight:      Height:        Intake/Output Summary (Last 24 hours) at 07/17/2019 9242 Last data filed at 07/17/2019 0500 Gross per 24 hour  Intake 360 ml  Output 875 ml  Net -515 ml   Filed Weights   07/12/19 0320  Weight: 82.1 kg   Physical Exam:  General:/O x4, positive acute respiratory distress Eyes: negative scleral hemorrhage, negative anisocoria, negative icterus ENT: Negative Runny nose, negative gingival bleeding, Neck:  Negative scars, masses, torticollis, lymphadenopathy, JVD Lungs: Clear to auscultation bilaterally without wheezes or crackles Cardiovascular: Regular rate and rhythm without murmur gallop or rub normal S1 and S2 Abdomen: negative abdominal pain, nondistended, positive soft, bowel sounds, no rebound, no ascites, no appreciable mass Extremities: No significant cyanosis, clubbing, or edema bilateral lower  extremities Skin: Negative rashes, lesions, ulcers Psychiatric:  Negative depression, negative anxiety, negative fatigue, negative mania positive perseveration Central nervous system:  Cranial nerves II through XII intact, tongue/uvula midline, all extremities muscle strength 5/5, sensation intact throughout, negative dysarthria, negative expressive aphasia, negative receptive aphasia.     Data Reviewed: Care during the described time interval was provided by me .  I have reviewed this patient's available data, including medical history, events of note, physical examination, and all test results as part of my evaluation.   CBC: Recent Labs  Lab 07/13/19 0200 07/14/19 0453 07/15/19 0600 07/16/19 0230 07/17/19 0120  WBC 5.0 9.2 4.7 5.1 5.9  NEUTROABS 3.9 7.6 3.6 3.8 4.3  HGB 13.4 14.7 13.7 12.4 12.5  HCT 41.4 44.0 42.9 38.0 38.6  MCV 92.8 90.2 93.1 92.0 92.8  PLT 157 264 275 304 683   Basic Metabolic Panel: Recent Labs  Lab 07/13/19 0200 07/14/19 0453 07/15/19 0600 07/16/19 0230 07/17/19 0120  NA 139 139 142 142 142  K 4.8 4.0 4.8 4.3 4.7  CL 103 104 106  108 108  CO2 _0 GLUCOSE 109* 149* 182* 168* 192*  BUN 27* 31* 38* 41* 41*  CREATININE 1.05* 1.05* 0.82 0.73 0.84  CALCIUM 8.0* 8.3* 8.2* 8.3* 8.4*  MG 1.6* 1.6* 2.1 2.1 2.0  PHOS 4.1 3.8 3.3 3.1 3.1   GFR: Estimated Creatinine Clearance: 38.6 mL/min (by C-G formula based on SCr of 0.84 mg/dL). Liver Function Tests: Recent Labs  Lab 07/13/19 0200 07/14/19 0453 07/15/19 0600 07/16/19 0230 07/17/19 0120  AST 37 44* 54* 78* 59*  ALT _1 45* 47*  ALKPHOS 45 49 51 49 45  BILITOT 0.2* 0.5 0.6 0.9 0.7  PROT 5.5* 5.9* 5.6* 5.3* 5.2*  ALBUMIN 2.4* 2.6* 2.5* 2.6* 2.5*   No results for input(s): LIPASE, AMYLASE in the last 168 hours. No results for input(s): AMMONIA in the last 168 hours. Coagulation Profile: Recent Labs  Lab 07/11/19 1908  INR 1.1   Cardiac Enzymes: No results for input(s):  CKTOTAL, CKMB, CKMBINDEX, TROPONINI in the last 168 hours. BNP (last 3 results) No results for input(s): PROBNP in the last 8760 hours. HbA1C: No results for input(s): HGBA1C in the last 72 hours. CBG: Recent Labs  Lab 07/14/19 0733  GLUCAP 131*   Lipid Profile: No results for input(s): CHOL, HDL, LDLCALC, TRIG, CHOLHDL, LDLDIRECT in the last 72 hours. Thyroid Function Tests: No results for input(s): TSH, T4TOTAL, FREET4, T3FREE, THYROIDAB in the last 72 hours. Anemia Panel: Recent Labs    07/16/19 0230 07/17/19 0120  FERRITIN 278 256   Urine analysis: No results found for: COLORURINE, APPEARANCEUR, LABSPEC, PHURINE, GLUCOSEU, HGBUR, BILIRUBINUR, KETONESUR, PROTEINUR, UROBILINOGEN, NITRITE, LEUKOCYTESUR Sepsis Labs: _2 (procalcitonin:4,lacticidven:4)  ) Recent Results (from the past 240 hour(s))  Blood Culture (routine x 2)     Status: None   Collection Time: 07/11/19  7:08 PM   Specimen: Right Antecubital; Blood  Result Value Ref Range Status   Specimen Description RIGHT ANTECUBITAL  Final   Special Requests   Final    BOTTLES DRAWN AEROBIC AND ANAEROBIC Blood Culture adequate volume   Culture   Final    NO GROWTH 5 DAYS Performed at [facility], [address] [place], [place] [address]    Report Status 07/16/2019 FINAL  Final  Blood Culture (routine x 2)     Status: None   Collection Time: 07/11/19  7:08 PM   Specimen: Left Antecubital; Blood  Result Value Ref Range Status   Specimen Description LEFT ANTECUBITAL  Final   Special Requests   Final    BOTTLES DRAWN AEROBIC AND ANAEROBIC Blood Culture adequate volume   Culture   Final    NO GROWTH 5 DAYS Performed at [facility], [address], [place], [place] [address]    Report Status 07/16/2019 FINAL  Final  MRSA PCR Screening     Status: None   Collection Time: 07/12/19  5:51 AM   Specimen: Nasopharyngeal  Result Value Ref Range Status   MRSA by PCR NEGATIVE NEGATIVE Final    Comment:        The  GeneXpert MRSA Assay (FDA approved for NASAL specimens only), is one component of a comprehensive MRSA colonization surveillance program. It is not intended to diagnose MRSA infection nor to guide or monitor treatment for MRSA infections. Performed at [facility], [place] [address] [place], [place] [address]          Radiology Studies: No results found.      Scheduled Meds: .  vitamin C  500 mg Oral Daily  . atenolol  25 mg Oral Daily  . benztropine  0.5 mg Oral Daily  . chlorhexidine  15 mL Mouth Rinse BID  . Chlorhexidine Gluconate Cloth  6 each Topical Daily  . dexamethasone (DECADRON) injection  6 mg Intravenous QHS  . dextromethorphan-guaiFENesin  1 tablet Oral BID  . divalproex  250 mg Oral BID  . donepezil  5 mg Oral QHS  . enoxaparin (LOVENOX) injection  40 mg Subcutaneous Q12H  . famotidine  20 mg Oral BID  . Ipratropium-Albuterol  1 puff Inhalation QID  . mouth rinse  15 mL Mouth Rinse q12n4p  . risperiDONE  1 mg Oral QHS  . zinc sulfate  220 mg Oral Daily   Continuous Infusions: . phenylephrine (NEO-SYNEPHRINE) Adult infusion Stopped (07/13/19 0331)     LOS: 6 days   The patient is critically ill with multiple organ systems failure and requires high complexity decision making for assessment and support, frequent evaluation and titration of therapies, application of advanced monitoring technologies and extensive interpretation of multiple databases. Critical Care Time devoted to patient care services described in this note  Time spent: 40 minutes     Chidinma Clites, Geraldo Docker, MD [facility] Pager [phone]  If 7PM-7AM, please contact night-coverage [email or web address] Password [email or web address] 07/17/2019, 9:21 AM

## 2019-07-17 NOTE — Progress Notes (Signed)
Attempted to contact family for update on patients status. No answer; line busy. Will continue to try

## 2019-07-17 NOTE — Progress Notes (Signed)
Spoke with daughter, Annice Pih, and gave her an update on patients status. She also spoke with patient for a while.

## 2019-07-18 LAB — CBC WITH DIFFERENTIAL/PLATELET
Abs Immature Granulocytes: 0.5 10*3/uL — ABNORMAL HIGH (ref 0.00–0.07)
Basophils Absolute: 0.1 10*3/uL (ref 0.0–0.1)
Basophils Relative: 1 %
Eosinophils Absolute: 0 10*3/uL (ref 0.0–0.5)
Eosinophils Relative: 0 %
HCT: 39 % (ref 36.0–46.0)
Hemoglobin: 12.6 g/dL (ref 12.0–15.0)
Immature Granulocytes: 6 %
Lymphocytes Relative: 11 %
Lymphs Abs: 0.9 10*3/uL (ref 0.7–4.0)
MCH: 29.7 pg (ref 26.0–34.0)
MCHC: 32.3 g/dL (ref 30.0–36.0)
MCV: 92 fL (ref 80.0–100.0)
Monocytes Absolute: 0.8 10*3/uL (ref 0.1–1.0)
Monocytes Relative: 9 %
Neutro Abs: 6.1 10*3/uL (ref 1.7–7.7)
Neutrophils Relative %: 73 %
Platelets: 356 10*3/uL (ref 150–400)
RBC: 4.24 MIL/uL (ref 3.87–5.11)
RDW: 13.3 % (ref 11.5–15.5)
WBC: 8.4 10*3/uL (ref 4.0–10.5)
nRBC: 0 % (ref 0.0–0.2)

## 2019-07-18 LAB — COMPREHENSIVE METABOLIC PANEL
ALT: 37 U/L (ref 0–44)
AST: 33 U/L (ref 15–41)
Albumin: 2.7 g/dL — ABNORMAL LOW (ref 3.5–5.0)
Alkaline Phosphatase: 45 U/L (ref 38–126)
Anion gap: 10 (ref 5–15)
BUN: 37 mg/dL — ABNORMAL HIGH (ref 8–23)
CO2: 25 mmol/L (ref 22–32)
Calcium: 8.6 mg/dL — ABNORMAL LOW (ref 8.9–10.3)
Chloride: 105 mmol/L (ref 98–111)
Creatinine, Ser: 0.79 mg/dL (ref 0.44–1.00)
GFR calc Af Amer: >60 mL/min (ref >60)
GFR calc non Af Amer: >60 mL/min (ref >60)
Glucose, Bld: 128 mg/dL — ABNORMAL HIGH (ref 70–99)
Potassium: 4.4 mmol/L (ref 3.5–5.1)
Sodium: 140 mmol/L (ref 135–145)
Total Bilirubin: 0.6 mg/dL (ref 0.3–1.2)
Total Protein: 5.5 g/dL — ABNORMAL LOW (ref 6.5–8.1)

## 2019-07-18 LAB — PHOSPHORUS: Phosphorus: 2.6 mg/dL (ref 2.5–4.6)

## 2019-07-18 LAB — MAGNESIUM: Magnesium: 1.8 mg/dL (ref 1.7–2.4)

## 2019-07-18 LAB — FERRITIN: Ferritin: 232 ng/mL (ref 11–307)

## 2019-07-18 LAB — D-DIMER, QUANTITATIVE: D-Dimer, Quant: 0.71 ug/mL-FEU — ABNORMAL HIGH (ref 0.00–0.50)

## 2019-07-18 LAB — C-REACTIVE PROTEIN: CRP: 1.2 mg/dL — ABNORMAL HIGH (ref <1.0)

## 2019-07-18 NOTE — Plan of Care (Signed)
  Problem: Education: Goal: Knowledge of risk factors and measures for prevention of condition will improve Outcome: Progressing   Problem: Coping: Goal: Psychosocial and spiritual needs will be supported Outcome: Progressing   Problem: Respiratory: Goal: Will maintain a patent airway Outcome: Progressing Goal: Complications related to the disease process, condition or treatment will be avoided or minimized Outcome: Progressing   

## 2019-07-18 NOTE — TOC Progression Note (Signed)
Transition of Care [facility]) - Progression Note    Patient Details  Name: Isabella Campbell MRN: [id number] Date of Birth: 09-21-1917  Transition of Care [facility]) CM/SW Contact  Loletha Grayer Beverely Pace, RN Phone Number: 07/18/2019, 2:53 PM  Clinical Narrative:   Case manager spoke with Gerald Stabs at [facility] concerning bed availability for patient . At this time it will hopefully be on tomorrow, pending a discharge. Case manager notified patient's daughter, Thayer Headings to inform her that bed is going to open up for her mom.She expressed her appreciation for everyone's hard work. CM Will follow up.       Expected Discharge Plan: [place] Barriers to Discharge: Continued Medical Work up  Expected Discharge Plan and Services Expected Discharge Plan: [place] In-house Referral: Clinical Social Work     Living arrangements for the past 2 months: [place]                                       Social Determinants of Health (SDOH) Interventions    Readmission Risk Interventions Readmission Risk Prevention Plan 07/15/2019  Transportation Screening Complete  Medication Review (RN CM) Complete  Some recent data might be hidden

## 2019-07-18 NOTE — Progress Notes (Signed)
Spoke with patients daughter, Annice Pih, and gave update on patients status. Daughter is aware of possible discharge tomorrow to the [facility] for rehabilitation.

## 2019-07-18 NOTE — Progress Notes (Signed)
PROGRESS NOTE    Isabella Campbell  TXM:[id number] DOB: 09-20-1917 DOA: 07/11/2019 PCP: Glenda Chroman, MD   Brief Narrative:  85 y.o. WF from [facility] facility PMHx bipolar 1 disorder, essential HTN, pulmonary HTN, CAD, COPD,  Sent to the [facility], ED due to altered mental status.  Baseline mental status unknown at this time due to patient being unable to provide history.  History was obtained from ED physician and ED chart.  Per report, patient was recently diagnosed with UTI and was started on Keflex.  ED Course: In the ED emergency room, patient was noted to be febrile with a temperature of 100.5, she was tachycardic and tachypneic with a BP of 175/84, O2 sats was 94% on supplemental oxygen via [place] at 2 LPM.  Work-up in the ED showed leukopenia, thrombocytopenia, hypoalbuminemia.  CRP was 4.9, D-dimer 1.46, fibrinogen 516, LDH 260, procalcitonin was negative, ferritin 230. POC SARS coronavirus 2 Ag test positive.  Chest x-ray shows COPD without acute airspace disease.  She was initially treated with IV ceftriaxone due to presumed CAP.  Hospitalist was asked to admit patient for further evaluation and management.   Subjective: 12/31 overnight afebrile.  A/O x4.  Negative abdominal pain, negative N/V/D.  Positive S OB     Assessment & Plan:   Principal Problem:   COVID-19 virus infection Active Problems:   Essential hypertension   COPD (chronic obstructive pulmonary disease) ([place])   Acute respiratory failure with hypoxia (HCC)   Altered mental status   Pressure injury of skin   Bipolar 1 disorder (HCC)  Covid pneumonia/acute respiratory failure with hypoxia COVID-19 Labs  Recent Labs    07/16/19 0230 07/17/19 0120 07/18/19 0010  DDIMER 0.75* 0.71* 0.71*  FERRITIN 278 256 232  CRP 2.3* 1.6* 1.2*    12/24 POC SARS coronavirus positive -Decadron 6 mg daily -Remdesivir per pharmacy protocol -Combivent QID -Flutter valve -Incentive spirometry -Vitamins per  Covid protocol -Titrate O2 to maintain SPO2> 88% -Prone patient 16 hours/day; if cannot tolerate 2 to 3 hours per shift -Out of bed to chair q shift  Sepsis/aspiration pneumonia -Patient initially met criteria for SIRS, tachycardic, tachypneic, leukopenic and febrile. -CXR s/p intubation showed increased airspace opacity in the right lung concerning for aspiration pneumonia -Initially started on empiric antibiotics.  Have been discontinued.   -Continue to trend procalcitonin restart if procalcitonin trends up, leukocytosis, fever.  Leukopenia/ Thrombocytopenia/lymphopenia possibly secondary to above vs reactive -Resolved  Altered Mental Status  -Unknown baseline.  A/O x4 with mild perseveration -Initially thought hospital delirium however patient had not been receiving her bipolar medication has improved significantly since meds restarted.  Bipolar 1 disorder -12/28 restarted bipolar medication  Essential hypertension -12/28 Atenolol 25 mg daily (home dose) -12/28 change scheduled hydralazine to PRN  History of COPD  -See Covid pneumonia  Dysphagia -11/28 passed swallow evaluation.  Regular diet thin liquids  Hypomagnesmia -Magnesium goal> 2     Goals of care -12/29 PT/OT; evaluate for SNF (what level in nursing facility should she return to?)    DVT prophylaxis: Lovenox Code Status: DNR Family Communication: 12/29 spoke at length with Thayer Headings (daughter) discussed plan of care answered all questions Disposition Plan: SNF   Consultants:  PCCM  Procedures/Significant Events:     I have personally reviewed and interpreted all radiology studies and my findings are as above.  VENTILATOR SETTINGS: HFNC 12/31 flow rate; 3 L/min SPO2 95%    Cultures   Antimicrobials: Anti-infectives (From admission, onward)  Start     Dose/Rate Stop   07/13/19 1000  remdesivir 100 mg in sodium chloride 0.9 % 100 mL IVPB     100 mg 200 mL/hr over 30 Minutes 07/17/19  0959   07/12/19 2000  cefTRIAXone (ROCEPHIN) 1 g in sodium chloride 0.9 % 100 mL IVPB  Status:  Discontinued     1 g 200 mL/hr over 30 Minutes 07/13/19 1423   07/12/19 0600  clindamycin (CLEOCIN) IVPB 600 mg  Status:  Discontinued     600 mg 100 mL/hr over 30 Minutes 07/12/19 0930   07/12/19 0230  remdesivir 200 mg in sodium chloride 0.9% 250 mL IVPB     200 mg 580 mL/hr over 30 Minutes 07/12/19 0714   07/11/19 2000  azithromycin (ZITHROMAX) 500 mg in sodium chloride 0.9 % 250 mL IVPB  Status:  Discontinued     500 mg 250 mL/hr over 60 Minutes 07/13/19 1423   07/11/19 1900  cefTRIAXone (ROCEPHIN) 2 g in sodium chloride 0.9 % 100 mL IVPB     2 g 200 mL/hr over 30 Minutes 07/11/19 2025       Devices     LINES / TUBES:      Continuous Infusions: . phenylephrine (NEO-SYNEPHRINE) Adult infusion Stopped (07/13/19 0331)     Objective: Vitals:   07/17/19 2345 07/18/19 0517 07/18/19 0810 07/18/19 1135  BP: (!) 142/78  127/66 (!) 186/97  Pulse:   89 80  Resp:  _0 Temp: 98.1 F (36.7 C) 98.4 F (36.9 C) 98.4 F (36.9 C) 97.7 F (36.5 C)  TempSrc: Oral Axillary Oral Oral  SpO2:   97% (!) 88%  Weight:      Height:        Intake/Output Summary (Last 24 hours) at 07/18/2019 1222 Last data filed at 07/18/2019 1047 Gross per 24 hour  Intake 400 ml  Output 576 ml  Net -176 ml   Filed Weights   07/12/19 0320  Weight: 82.1 kg   Physical Exam:  General: A/O x4, positive acute respiratory distress Eyes: negative scleral hemorrhage, negative anisocoria, negative icterus ENT: Negative Runny nose, negative gingival bleeding, Neck:  Negative scars, masses, torticollis, lymphadenopathy, JVD Lungs: Clear to auscultation bilaterally without wheezes or crackles Cardiovascular: Regular rate and rhythm without murmur gallop or rub normal S1 and S2 Abdomen: negative abdominal pain, nondistended, positive soft, bowel sounds, no rebound, no ascites, no appreciable  mass Extremities: No significant cyanosis, clubbing, or edema bilateral lower extremities Skin: Negative rashes, lesions, ulcers Psychiatric:  Negative depression, negative anxiety, negative fatigue, negative mania  Central nervous system:  Cranial nerves II through XII intact, tongue/uvula midline, all extremities muscle strength 5/5, sensation intact throughout,  negative dysarthria, negative expressive aphasia, negative receptive aphasia.     Data Reviewed: Care during the described time interval was provided by me .  I have reviewed this patient's available data, including medical history, events of note, physical examination, and all test results as part of my evaluation.   CBC: Recent Labs  Lab 07/14/19 0453 07/15/19 0600 07/16/19 0230 07/17/19 0120 07/18/19 0010  WBC 9.2 4.7 5.1 5.9 8.4  NEUTROABS 7.6 3.6 3.8 4.3 6.1  HGB 14.7 13.7 12.4 12.5 12.6  HCT 44.0 42.9 38.0 38.6 39.0  MCV 90.2 93.1 92.0 92.8 92.0  PLT 264 275 304 312 276   Basic Metabolic Panel: Recent Labs  Lab 07/14/19 0453 07/15/19 0600 07/16/19 0230 07/17/19 0120 07/18/19 0010  NA 139 142 142 142 140  K 4.0 4.8 4.3 4.7 4.4  CL 104 106 108 108 105  CO2 _0 GLUCOSE 149* 182* 168* 192* 128*  BUN 31* 38* 41* 41* 37*  CREATININE 1.05* 0.82 0.73 0.84 0.79  CALCIUM 8.3* 8.2* 8.3* 8.4* 8.6*  MG 1.6* 2.1 2.1 2.0 1.8  PHOS 3.8 3.3 3.1 3.1 2.6   GFR: Estimated Creatinine Clearance: 40.5 mL/min (by C-G formula based on SCr of 0.79 mg/dL). Liver Function Tests: Recent Labs  Lab 07/14/19 0453 07/15/19 0600 07/16/19 0230 07/17/19 0120 07/18/19 0010  AST 44* 54* 78* 59* 33  ALT 17 25 45* 47* 37  ALKPHOS 49 51 49 45 45  BILITOT 0.5 0.6 0.9 0.7 0.6  PROT 5.9* 5.6* 5.3* 5.2* 5.5*  ALBUMIN 2.6* 2.5* 2.6* 2.5* 2.7*   No results for input(s): LIPASE, AMYLASE in the last 168 hours. No results for input(s): AMMONIA in the last 168 hours. Coagulation Profile: Recent Labs  Lab 07/11/19 1908   INR 1.1   Cardiac Enzymes: No results for input(s): CKTOTAL, CKMB, CKMBINDEX, TROPONINI in the last 168 hours. BNP (last 3 results) No results for input(s): PROBNP in the last 8760 hours. HbA1C: No results for input(s): HGBA1C in the last 72 hours. CBG: Recent Labs  Lab 07/14/19 0733  GLUCAP 131*   Lipid Profile: No results for input(s): CHOL, HDL, LDLCALC, TRIG, CHOLHDL, LDLDIRECT in the last 72 hours. Thyroid Function Tests: No results for input(s): TSH, T4TOTAL, FREET4, T3FREE, THYROIDAB in the last 72 hours. Anemia Panel: Recent Labs    07/17/19 0120 07/18/19 0010  FERRITIN 256 232   Urine analysis: No results found for: COLORURINE, APPEARANCEUR, LABSPEC, [place], GLUCOSEU, HGBUR, BILIRUBINUR, KETONESUR, [place], [place], NITRITE, LEUKOCYTESUR Sepsis Labs: _1 (procalcitonin:4,lacticidven:4)  ) Recent Results (from the past 240 hour(s))  Blood Culture (routine x 2)     Status: None   Collection Time: 07/11/19  7:08 PM   Specimen: Right Antecubital; Blood  Result Value Ref Range Status   Specimen Description RIGHT ANTECUBITAL  Final   Special Requests   Final    BOTTLES DRAWN AEROBIC AND ANAEROBIC Blood Culture adequate volume   Culture   Final    NO GROWTH 5 DAYS Performed at [facility], [address] [place], [place] [address]    Report Status 07/16/2019 FINAL  Final  Blood Culture (routine x 2)     Status: None   Collection Time: 07/11/19  7:08 PM   Specimen: Left Antecubital; Blood  Result Value Ref Range Status   Specimen Description LEFT ANTECUBITAL  Final   Special Requests   Final    BOTTLES DRAWN AEROBIC AND ANAEROBIC Blood Culture adequate volume   Culture   Final    NO GROWTH 5 DAYS Performed at [facility], [address] [place], [place] [address]    Report Status 07/16/2019 FINAL  Final  MRSA PCR Screening     Status: None   Collection Time: 07/12/19  5:51 AM   Specimen: Nasopharyngeal  Result Value Ref Range Status    MRSA by PCR NEGATIVE NEGATIVE Final    Comment:        The GeneXpert MRSA Assay (FDA approved for NASAL specimens only), is one component of a comprehensive MRSA colonization surveillance program. It is not intended to diagnose MRSA infection nor to guide or monitor treatment for MRSA infections. Performed at [facility], [place] [address], [place],  [address]          Radiology  Studies: No results found.      Scheduled Meds: . vitamin C  500 mg Oral Daily  . atenolol  25 mg Oral Daily  . benztropine  0.5 mg Oral Daily  . chlorhexidine  15 mL Mouth Rinse BID  . Chlorhexidine Gluconate Cloth  6 each Topical Daily  . dexamethasone (DECADRON) injection  6 mg Intravenous QHS  . dextromethorphan-guaiFENesin  1 tablet Oral BID  . divalproex  250 mg Oral BID  . donepezil  5 mg Oral QHS  . enoxaparin (LOVENOX) injection  40 mg Subcutaneous Q12H  . famotidine  20 mg Oral BID  . Ipratropium-Albuterol  1 puff Inhalation QID  . mouth rinse  15 mL Mouth Rinse q12n4p  . risperiDONE  1 mg Oral QHS  . zinc sulfate  220 mg Oral Daily   Continuous Infusions: . phenylephrine (NEO-SYNEPHRINE) Adult infusion Stopped (07/13/19 0331)     LOS: 7 days   The patient is critically ill with multiple organ systems failure and requires high complexity decision making for assessment and support, frequent evaluation and titration of therapies, application of advanced monitoring technologies and extensive interpretation of multiple databases. Critical Care Time devoted to patient care services described in this note  Time spent: 40 minutes     Melda Mermelstein, Geraldo Docker, MD [facility] Pager [phone]  If 7PM-7AM, please contact night-coverage [email or web address] Password [email or web address] 07/18/2019, 12:22 PM

## 2019-07-19 LAB — CBC WITH DIFFERENTIAL/PLATELET
Abs Immature Granulocytes: 0.66 10*3/uL — ABNORMAL HIGH (ref 0.00–0.07)
Basophils Absolute: 0 10*3/uL (ref 0.0–0.1)
Basophils Relative: 0 %
Eosinophils Absolute: 0 10*3/uL (ref 0.0–0.5)
Eosinophils Relative: 0 %
HCT: 41.1 % (ref 36.0–46.0)
Hemoglobin: 13.3 g/dL (ref 12.0–15.0)
Immature Granulocytes: 9 %
Lymphocytes Relative: 13 %
Lymphs Abs: 1 10*3/uL (ref 0.7–4.0)
MCH: 29.6 pg (ref 26.0–34.0)
MCHC: 32.4 g/dL (ref 30.0–36.0)
MCV: 91.5 fL (ref 80.0–100.0)
Monocytes Absolute: 0.4 10*3/uL (ref 0.1–1.0)
Monocytes Relative: 5 %
Neutro Abs: 5.6 10*3/uL (ref 1.7–7.7)
Neutrophils Relative %: 73 %
Platelets: 391 10*3/uL (ref 150–400)
RBC: 4.49 MIL/uL (ref 3.87–5.11)
RDW: 13.3 % (ref 11.5–15.5)
WBC: 7.6 10*3/uL (ref 4.0–10.5)
nRBC: 0 % (ref 0.0–0.2)

## 2019-07-19 LAB — COMPREHENSIVE METABOLIC PANEL
ALT: 31 U/L (ref 0–44)
AST: 24 U/L (ref 15–41)
Albumin: 2.7 g/dL — ABNORMAL LOW (ref 3.5–5.0)
Alkaline Phosphatase: 52 U/L (ref 38–126)
Anion gap: 10 (ref 5–15)
BUN: 37 mg/dL — ABNORMAL HIGH (ref 8–23)
CO2: 26 mmol/L (ref 22–32)
Calcium: 8.9 mg/dL (ref 8.9–10.3)
Chloride: 107 mmol/L (ref 98–111)
Creatinine, Ser: 0.74 mg/dL (ref 0.44–1.00)
GFR calc Af Amer: >60 mL/min (ref >60)
GFR calc non Af Amer: >60 mL/min (ref >60)
Glucose, Bld: 161 mg/dL — ABNORMAL HIGH (ref 70–99)
Potassium: 4.4 mmol/L (ref 3.5–5.1)
Sodium: 143 mmol/L (ref 135–145)
Total Bilirubin: 0.9 mg/dL (ref 0.3–1.2)
Total Protein: 5.9 g/dL — ABNORMAL LOW (ref 6.5–8.1)

## 2019-07-19 LAB — D-DIMER, QUANTITATIVE: D-Dimer, Quant: 0.7 ug/mL-FEU — ABNORMAL HIGH (ref 0.00–0.50)

## 2019-07-19 LAB — PHOSPHORUS: Phosphorus: 4 mg/dL (ref 2.5–4.6)

## 2019-07-19 LAB — MAGNESIUM: Magnesium: 1.9 mg/dL (ref 1.7–2.4)

## 2019-07-19 NOTE — Progress Notes (Signed)
PROGRESS NOTE    Isabella Campbell  ESP:[id number] DOB: [birth year] DOA: 07/11/2019 PCP: Glenda Chroman, MD   Brief Narrative:  86 y.o. WF from [facility] facility PMHx bipolar 1 disorder, essential HTN, pulmonary HTN, CAD, COPD,  Sent to the [facility], ED due to altered mental status.  Baseline mental status unknown at this time due to patient being unable to provide history.  History was obtained from ED physician and ED chart.  Per report, patient was recently diagnosed with UTI and was started on Keflex.  ED Course: In the ED emergency room, patient was noted to be febrile with a temperature of 100.5, she was tachycardic and tachypneic with a BP of 175/84, O2 sats was 94% on supplemental oxygen via [place] at 2 LPM.  Work-up in the ED showed leukopenia, thrombocytopenia, hypoalbuminemia.  CRP was 4.9, D-dimer 1.46, fibrinogen 516, LDH 260, procalcitonin was negative, ferritin 230. POC SARS coronavirus 2 Ag test positive.  Chest x-ray shows COPD without acute airspace disease.  She was initially treated with IV ceftriaxone due to presumed CAP.  Hospitalist was asked to admit patient for further evaluation and management.   Subjective: 1/1   Afebrile last 24 hours A/O x4, negative abdominal pain, sitting in chair comfortably.  Positive S OB   Assessment & Plan:   Principal Problem:   COVID-19 virus infection Active Problems:   Essential hypertension   COPD (chronic obstructive pulmonary disease) ([place])   Acute respiratory failure with hypoxia (HCC)   Altered mental status   Pressure injury of skin   Bipolar 1 disorder (HCC)  Covid pneumonia/acute respiratory failure with hypoxia COVID-19 Labs  Recent Labs    07/17/19 0120 07/18/19 0010 07/19/19 0908  DDIMER 0.71* 0.71* 0.70*  FERRITIN 256 232  --   CRP 1.6* 1.2*  --     12/24 POC SARS coronavirus positive -Decadron 6 mg daily -Remdesivir per pharmacy protocol -Combivent QID -Flutter valve -Incentive spirometry  -Vitamins per Covid protocol -Titrate O2 to maintain SPO2> 88% -Prone patient 16 hours/day; if cannot tolerate 2 to 3 hours per shift -Out of bed to chair q shift  Sepsis/aspiration pneumonia -Patient initially met criteria for SIRS, tachycardic, tachypneic, leukopenic and febrile. -CXR s/p intubation showed increased airspace opacity in the right lung concerning for aspiration pneumonia -Initially started on empiric antibiotics.  Have been discontinued.   -Continue to trend procalcitonin restart if procalcitonin trends up, leukocytosis, fever.  Leukopenia/ Thrombocytopenia/lymphopenia possibly secondary to above vs reactive -Resolved  Altered Mental Status  -Unknown baseline.  A/O x4 with mild perseveration -Initially thought hospital delirium however patient had not been receiving her bipolar medication has improved significantly since meds restarted.  Bipolar 1 disorder -12/28 restarted bipolar medication  Essential hypertension -12/28 Atenolol 25 mg daily (home dose) -12/28 change scheduled hydralazine to PRN  History of COPD  -See Covid pneumonia  Dysphagia -11/28 passed swallow evaluation.  Regular diet thin liquids  Hypomagnesmia -Magnesium goal> 2     Goals of care -12/29 PT/OT; evaluate for SNF (what level in nursing facility should she return to?)    DVT prophylaxis: Lovenox Code Status: DNR Family Communication: 12/29 spoke at length with Thayer Headings (daughter) discussed plan of care answered all questions Disposition Plan: SNF   Consultants:  PCCM  Procedures/Significant Events:     I have personally reviewed and interpreted all radiology studies and my findings are as above.  VENTILATOR SETTINGS: Nasal cannula 1/1 flow rate; 2 L/min  SPO2 98%  Cultures   Antimicrobials: Anti-infectives (From admission, onward)   Start     Dose/Rate Stop   07/13/19 1000  remdesivir 100 mg in sodium chloride 0.9 % 100 mL IVPB     100 mg 200 mL/hr over  30 Minutes 07/17/19 0959   07/12/19 2000  cefTRIAXone (ROCEPHIN) 1 g in sodium chloride 0.9 % 100 mL IVPB  Status:  Discontinued     1 g 200 mL/hr over 30 Minutes 07/13/19 1423   07/12/19 0600  clindamycin (CLEOCIN) IVPB 600 mg  Status:  Discontinued     600 mg 100 mL/hr over 30 Minutes 07/12/19 0930   07/12/19 0230  remdesivir 200 mg in sodium chloride 0.9% 250 mL IVPB     200 mg 580 mL/hr over 30 Minutes 07/12/19 0714   07/11/19 2000  azithromycin (ZITHROMAX) 500 mg in sodium chloride 0.9 % 250 mL IVPB  Status:  Discontinued     500 mg 250 mL/hr over 60 Minutes 07/13/19 1423   07/11/19 1900  cefTRIAXone (ROCEPHIN) 2 g in sodium chloride 0.9 % 100 mL IVPB     2 g 200 mL/hr over 30 Minutes 07/11/19 2025       Devices     LINES / TUBES:      Continuous Infusions: . phenylephrine (NEO-SYNEPHRINE) Adult infusion Stopped (07/13/19 0331)     Objective: Vitals:   07/19/19 0911 07/19/19 1137 07/19/19 1235 07/19/19 1540  BP:  (!) 142/67 (!) 142/81 102/82  Pulse: 73 80 80 88  Resp:  '18 16 17  '$ Temp:  97.9 F (36.6 C) (!) 97.5 F (36.4 C) 98.1 F (36.7 C)  TempSrc:  Oral Axillary Oral  SpO2:  98% 98% 98%  Weight:      Height:        Intake/Output Summary (Last 24 hours) at 07/19/2019 1851 Last data filed at 07/19/2019 1601 Gross per 24 hour  Intake 660 ml  Output 600 ml  Net 60 ml   Filed Weights   07/12/19 0320  Weight: 82.1 kg    Physical Exam:  General: A/O x4, positive acute respiratory distress Eyes: negative scleral hemorrhage, negative anisocoria, negative icterus ENT: Negative Runny nose, negative gingival bleeding, Neck:  Negative scars, masses, torticollis, lymphadenopathy, JVD Lungs: Clear to auscultation bilaterally without wheezes or crackles Cardiovascular: Regular rate and rhythm without murmur gallop or rub normal S1 and S2 Abdomen: negative abdominal pain, nondistended, positive soft, bowel sounds, no rebound, no ascites, no appreciable mass  Extremities: No significant cyanosis, clubbing, or edema bilateral lower extremities Skin: Negative rashes, lesions, ulcers Psychiatric:  Negative depression, negative anxiety, negative fatigue, negative mania  Central nervous system:  Cranial nerves II through XII intact, tongue/uvula midline, all extremities muscle strength 5/5, sensation intact throughout,  negative dysarthria, negative expressive aphasia, negative receptive aphasia.     Data Reviewed: Care during the described time interval was provided by me .  I have reviewed this patient's available data, including medical history, events of note, physical examination, and all test results as part of my evaluation.   CBC: Recent Labs  Lab 07/15/19 0600 07/16/19 0230 07/17/19 0120 07/18/19 0010 07/19/19 0908  WBC 4.7 5.1 5.9 8.4 7.6  NEUTROABS 3.6 3.8 4.3 6.1 5.6  HGB 13.7 12.4 12.5 12.6 13.3  HCT 42.9 38.0 38.6 39.0 41.1  MCV 93.1 92.0 92.8 92.0 91.5  PLT 275 304 312 356 161   Basic Metabolic Panel: Recent Labs  Lab 07/15/19 0600 07/16/19 0230 07/17/19 0120 07/18/19 0010 07/19/19  0908  NA 142 142 142 140 143  K 4.8 4.3 4.7 4.4 4.4  CL 106 108 108 105 107  CO2 '23 27 25 25 26  '$ GLUCOSE 182* 168* 192* 128* 161*  BUN 38* 41* 41* 37* 37*  CREATININE 0.82 0.73 0.84 0.79 0.74  CALCIUM 8.2* 8.3* 8.4* 8.6* 8.9  MG 2.1 2.1 2.0 1.8 1.9  PHOS 3.3 3.1 3.1 2.6 4.0   GFR: Estimated Creatinine Clearance: 40.5 mL/min (by C-G formula based on SCr of 0.74 mg/dL). Liver Function Tests: Recent Labs  Lab 07/15/19 0600 07/16/19 0230 07/17/19 0120 07/18/19 0010 07/19/19 0908  AST 54* 78* 59* 33 24  ALT 25 45* 47* 37 31  ALKPHOS 51 49 45 45 52  BILITOT 0.6 0.9 0.7 0.6 0.9  PROT 5.6* 5.3* 5.2* 5.5* 5.9*  ALBUMIN 2.5* 2.6* 2.5* 2.7* 2.7*   No results for input(s): LIPASE, AMYLASE in the last 168 hours. No results for input(s): AMMONIA in the last 168 hours. Coagulation Profile: No results for input(s): INR, PROTIME in the  last 168 hours. Cardiac Enzymes: No results for input(s): CKTOTAL, CKMB, CKMBINDEX, TROPONINI in the last 168 hours. BNP (last 3 results) No results for input(s): PROBNP in the last 8760 hours. HbA1C: No results for input(s): HGBA1C in the last 72 hours. CBG: Recent Labs  Lab 07/14/19 0733  GLUCAP 131*   Lipid Profile: No results for input(s): CHOL, HDL, LDLCALC, TRIG, CHOLHDL, LDLDIRECT in the last 72 hours. Thyroid Function Tests: No results for input(s): TSH, T4TOTAL, FREET4, T3FREE, THYROIDAB in the last 72 hours. Anemia Panel: Recent Labs    07/17/19 0120 07/18/19 0010  FERRITIN 256 232   Urine analysis: No results found for: COLORURINE, APPEARANCEUR, LABSPEC, [place]UR[place], GLUCOSEU, HGBUR, BILIRUBINUR, KETONESUR, [place], UROBILINOGEN, NITRITE, LEUKOCYTESUR Sepsis Labs: '@[id number]'$ (procalcitonin:4,lacticidven:4)  ) Recent Results (from the past 240 hour(s))  Blood Culture (routine x 2)     Status: None   Collection Time: 07/11/19  7:08 PM   Specimen: Right Antecubital; Blood  Result Value Ref Range Status   Specimen Description RIGHT ANTECUBITAL  Final   Special Requests   Final    BOTTLES DRAWN AEROBIC AND ANAEROBIC Blood Culture adequate volume   Culture   Final    NO GROWTH 5 DAYS Performed at [facility], [address], [place], [place] [address]    Report Status 07/16/2019 FINAL  Final  Blood Culture (routine x 2)     Status: None   Collection Time: 07/11/19  7:08 PM   Specimen: Left Antecubital; Blood  Result Value Ref Range Status   Specimen Description LEFT ANTECUBITAL  Final   Special Requests   Final    BOTTLES DRAWN AEROBIC AND ANAEROBIC Blood Culture adequate volume   Culture   Final    NO GROWTH 5 DAYS Performed at [facility], [address] [place], [place] [address]    Report Status 07/16/2019 FINAL  Final  MRSA PCR Screening     Status: None   Collection Time: 07/12/19  5:51 AM   Specimen: Nasopharyngeal  Result Value Ref Range Status    MRSA by PCR NEGATIVE NEGATIVE Final    Comment:        The GeneXpert MRSA Assay (FDA approved for NASAL specimens only), is one component of a comprehensive MRSA colonization surveillance program. It is not intended to diagnose MRSA infection nor to guide or monitor treatment for MRSA infections. Performed at [facility], [place] [address], [place], [place] [address]  Radiology Studies: No results found.      Scheduled Meds: . vitamin C  500 mg Oral Daily  . atenolol  25 mg Oral Daily  . benztropine  0.5 mg Oral Daily  . chlorhexidine  15 mL Mouth Rinse BID  . Chlorhexidine Gluconate Cloth  6 each Topical Daily  . dexamethasone (DECADRON) injection  6 mg Intravenous QHS  . dextromethorphan-guaiFENesin  1 tablet Oral BID  . divalproex  250 mg Oral BID  . donepezil  5 mg Oral QHS  . enoxaparin (LOVENOX) injection  40 mg Subcutaneous Q12H  . famotidine  20 mg Oral BID  . Ipratropium-Albuterol  1 puff Inhalation QID  . mouth rinse  15 mL Mouth Rinse q12n4p  . risperiDONE  1 mg Oral QHS  . zinc sulfate  220 mg Oral Daily   Continuous Infusions: . phenylephrine (NEO-SYNEPHRINE) Adult infusion Stopped (07/13/19 0331)     LOS: 8 days   The patient is critically ill with multiple organ systems failure and requires high complexity decision making for assessment and support, frequent evaluation and titration of therapies, application of advanced monitoring technologies and extensive interpretation of multiple databases. Critical Care Time devoted to patient care services described in this note  Time spent: 40 minutes     Darnell Jeschke, Geraldo Docker, MD [facility] Pager [phone]  If 7PM-7AM, please contact night-coverage [email or web address] Password [facility] 07/19/2019, 6:51 PM

## 2019-07-20 NOTE — Progress Notes (Addendum)
PROGRESS NOTE    Isabella Campbell  EGB:[id number] DOB: Jan 23, 1918 DOA: 07/11/2019 PCP: Glenda Chroman, MD   Brief Narrative:  86 y.o. WF from [facility] nursing facility PMHx bipolar 1 disorder, essential HTN, pulmonary HTN, CAD, COPD,  Sent to the [facility], ED due to altered mental status.  Baseline mental status unknown at this time due to patient being unable to provide history.  History was obtained from ED physician and ED chart.  Per report, patient was recently diagnosed with UTI and was started on Keflex.  ED Course: In the ED emergency room, patient was noted to be febrile with a temperature of 100.5, she was tachycardic and tachypneic with a BP of 175/84, O2 sats was 94% on supplemental oxygen via [place] at 2 LPM.  Work-up in the ED showed leukopenia, thrombocytopenia, hypoalbuminemia.  CRP was 4.9, D-dimer 1.46, fibrinogen 516, LDH 260, procalcitonin was negative, ferritin 230. POC SARS coronavirus 2 Ag test positive.  Chest x-ray shows COPD without acute airspace disease.  She was initially treated with IV ceftriaxone due to presumed CAP.  Hospitalist was asked to admit patient for further evaluation and management.   Subjective: 1/2 afebrile last 24 hours A/O x4, negative abdominal pain.  Positive S OB   Assessment & Plan:   Principal Problem:   COVID-19 virus infection Active Problems:   Essential hypertension   COPD (chronic obstructive pulmonary disease) (HCC)   Acute respiratory failure with hypoxia (HCC)   Altered mental status   Pressure injury of skin   Bipolar 1 disorder (HCC)  Covid pneumonia/acute respiratory failure with hypoxia COVID-19 Labs  Recent Labs    07/18/19 0010 07/19/19 0908  DDIMER 0.71* 0.70*  FERRITIN 232  --   CRP 1.2*  --     12/24 POC SARS coronavirus positive -Decadron 6 mg daily -Remdesivir per pharmacy protocol -Combivent QID -Flutter valve -Incentive spirometry -Vitamins per Covid protocol -Titrate O2 to maintain SPO2>  88% -Prone patient 16 hours/day; if cannot tolerate 2 to 3 hours per shift -Out of bed to chair q shift  Sepsis/aspiration pneumonia -Patient initially met criteria for SIRS, tachycardic, tachypneic, leukopenic and febrile. -CXR s/p intubation showed increased airspace opacity in the right lung concerning for aspiration pneumonia -Initially started on empiric antibiotics.  Have been discontinued.   -Continue to trend procalcitonin restart if procalcitonin trends up, leukocytosis, fever.  Leukopenia/ Thrombocytopenia/lymphopenia possibly secondary to above vs reactive -Resolved  Altered Mental Status  - A/O x4 with mild perseveration -Initially thought hospital delirium however patient had not been receiving her bipolar medication has improved significantly since meds restarted.  Bipolar 1 disorder -12/28 restarted bipolar medication  Essential hypertension -12/28 Atenolol 25 mg daily (home dose) -12/28 change scheduled hydralazine to PRN  History of COPD  -See Covid pneumonia  Dysphagia -11/28 passed swallow evaluation.  Regular diet thin liquids  Hypomagnesmia -Magnesium goal> 2     Goals of care -12/29 PT/OT; evaluate for SNF (what level in nursing facility should she return to?) -1/2 have been waiting clearance for SNF past 3 days.  Patient ready    DVT prophylaxis: Lovenox Code Status: DNR Family Communication: 07/20/2019 spoke at length with Thayer Headings (daughter) discussed plan of care answered all questions Disposition Plan: SNF   Consultants:  PCCM  Procedures/Significant Events:     I have personally reviewed and interpreted all radiology studies and my findings are as above.  VENTILATOR SETTINGS: Nasal cannula 1/2 flow rate; 1 L/min SPO2 94%    Cultures  Antimicrobials: Anti-infectives (From admission, onward)   Start     Dose/Rate Stop   07/13/19 1000  remdesivir 100 mg in sodium chloride 0.9 % 100 mL IVPB     100 mg 200 mL/hr over 30  Minutes 07/17/19 0959   07/12/19 2000  cefTRIAXone (ROCEPHIN) 1 g in sodium chloride 0.9 % 100 mL IVPB  Status:  Discontinued     1 g 200 mL/hr over 30 Minutes 07/13/19 1423   07/12/19 0600  clindamycin (CLEOCIN) IVPB 600 mg  Status:  Discontinued     600 mg 100 mL/hr over 30 Minutes 07/12/19 0930   07/12/19 0230  remdesivir 200 mg in sodium chloride 0.9% 250 mL IVPB     200 mg 580 mL/hr over 30 Minutes 07/12/19 0714   07/11/19 2000  azithromycin (ZITHROMAX) 500 mg in sodium chloride 0.9 % 250 mL IVPB  Status:  Discontinued     500 mg 250 mL/hr over 60 Minutes 07/13/19 1423   07/11/19 1900  cefTRIAXone (ROCEPHIN) 2 g in sodium chloride 0.9 % 100 mL IVPB     2 g 200 mL/hr over 30 Minutes 07/11/19 2025       Devices     LINES / TUBES:      Continuous Infusions: . phenylephrine (NEO-SYNEPHRINE) Adult infusion Stopped (07/13/19 0331)     Objective: Vitals:   07/19/19 1945 07/20/19 0000 07/20/19 0422 07/20/19 0800  BP: (!) 129/54 (!) 99/47 (!) 111/44 129/74  Pulse: 72 63 64 66  Resp:   16   Temp: 97.6 F (36.4 C) 98.6 F (37 C) 98.4 F (36.9 C) 98.2 F (36.8 C)  TempSrc: Axillary Axillary Axillary Oral  SpO2: 92% 91% 91% 94%  Weight:      Height:        Intake/Output Summary (Last 24 hours) at 07/20/2019 1111 Last data filed at 07/19/2019 2000 Gross per 24 hour  Intake 420 ml  Output 250 ml  Net 170 ml   Filed Weights   07/12/19 0320  Weight: 82.1 kg    Physical Exam:  General: A/O x4, positive acute respiratory distress Eyes: negative scleral hemorrhage, negative anisocoria, negative icterus ENT: Negative Runny nose, negative gingival bleeding, Neck:  Negative scars, masses, torticollis, lymphadenopathy, JVD Lungs: Clear to auscultation bilaterally without wheezes or crackles Cardiovascular: Regular rate and rhythm without murmur gallop or rub normal S1 and S2 Abdomen: negative abdominal pain, nondistended, positive soft, bowel sounds, no rebound, no  ascites, no appreciable mass Extremities: No significant cyanosis, clubbing, or edema bilateral lower extremities Skin: Negative rashes, lesions, ulcers Psychiatric:  Negative depression, negative anxiety, negative fatigue, negative mania  Central nervous system:  Cranial nerves II through XII intact, tongue/uvula midline, all extremities muscle strength 5/5, sensation intact throughout,  negative dysarthria, negative expressive aphasia, negative receptive aphasia.     Data Reviewed: Care during the described time interval was provided by me .  I have reviewed this patient's available data, including medical history, events of note, physical examination, and all test results as part of my evaluation.   CBC: Recent Labs  Lab 07/15/19 0600 07/16/19 0230 07/17/19 0120 07/18/19 0010 07/19/19 0908  WBC 4.7 5.1 5.9 8.4 7.6  NEUTROABS 3.6 3.8 4.3 6.1 5.6  HGB 13.7 12.4 12.5 12.6 13.3  HCT 42.9 38.0 38.6 39.0 41.1  MCV 93.1 92.0 92.8 92.0 91.5  PLT 275 304 312 356 962   Basic Metabolic Panel: Recent Labs  Lab 07/15/19 0600 07/16/19 0230 07/17/19 0120 07/18/19 0010 07/19/19  0908  NA 142 142 142 140 143  K 4.8 4.3 4.7 4.4 4.4  CL 106 108 108 105 107  CO2 _0 GLUCOSE 182* 168* 192* 128* 161*  BUN 38* 41* 41* 37* 37*  CREATININE 0.82 0.73 0.84 0.79 0.74  CALCIUM 8.2* 8.3* 8.4* 8.6* 8.9  MG 2.1 2.1 2.0 1.8 1.9  PHOS 3.3 3.1 3.1 2.6 4.0   GFR: Estimated Creatinine Clearance: 40.5 mL/min (by C-G formula based on SCr of 0.74 mg/dL). Liver Function Tests: Recent Labs  Lab 07/15/19 0600 07/16/19 0230 07/17/19 0120 07/18/19 0010 07/19/19 0908  AST 54* 78* 59* 33 24  ALT 25 45* 47* 37 31  ALKPHOS 51 49 45 45 52  BILITOT 0.6 0.9 0.7 0.6 0.9  PROT 5.6* 5.3* 5.2* 5.5* 5.9*  ALBUMIN 2.5* 2.6* 2.5* 2.7* 2.7*   No results for input(s): LIPASE, AMYLASE in the last 168 hours. No results for input(s): AMMONIA in the last 168 hours. Coagulation Profile: No results for  input(s): INR, PROTIME in the last 168 hours. Cardiac Enzymes: No results for input(s): CKTOTAL, CKMB, CKMBINDEX, TROPONINI in the last 168 hours. BNP (last 3 results) No results for input(s): PROBNP in the last 8760 hours. HbA1C: No results for input(s): HGBA1C in the last 72 hours. CBG: Recent Labs  Lab 07/14/19 0733  GLUCAP 131*   Lipid Profile: No results for input(s): CHOL, HDL, LDLCALC, TRIG, CHOLHDL, LDLDIRECT in the last 72 hours. Thyroid Function Tests: No results for input(s): TSH, T4TOTAL, FREET4, T3FREE, THYROIDAB in the last 72 hours. Anemia Panel: Recent Labs    07/18/19 0010  FERRITIN 232   Urine analysis: No results found for: COLORURINE, APPEARANCEUR, LABSPEC, [place], GLUCOSEU, HGBUR, BILIRUBINUR, [place], [place], [place], NITRITE, LEUKOCYTESUR Sepsis Labs: _1 (procalcitonin:4,lacticidven:4)  ) Recent Results (from the past 240 hour(s))  Blood Culture (routine x 2)     Status: None   Collection Time: 07/11/19  7:08 PM   Specimen: Right Antecubital; Blood  Result Value Ref Range Status   Specimen Description RIGHT ANTECUBITAL  Final   Special Requests   Final    BOTTLES DRAWN AEROBIC AND ANAEROBIC Blood Culture adequate volume   Culture   Final    NO GROWTH 5 DAYS Performed at [facility], [address], [place], [place] [address]    Report Status 07/16/2019 FINAL  Final  Blood Culture (routine x 2)     Status: None   Collection Time: 07/11/19  7:08 PM   Specimen: Left Antecubital; Blood  Result Value Ref Range Status   Specimen Description LEFT ANTECUBITAL  Final   Special Requests   Final    BOTTLES DRAWN AEROBIC AND ANAEROBIC Blood Culture adequate volume   Culture   Final    NO GROWTH 5 DAYS Performed at [facility], [address], [place], [place] [address]    Report Status 07/16/2019 FINAL  Final  MRSA PCR Screening     Status: None   Collection Time: 07/12/19  5:51 AM   Specimen: Nasopharyngeal  Result Value Ref Range  Status   MRSA by PCR NEGATIVE NEGATIVE Final    Comment:        The GeneXpert MRSA Assay (FDA approved for NASAL specimens only), is one component of a comprehensive MRSA colonization surveillance program. It is not intended to diagnose MRSA infection nor to guide or monitor treatment for MRSA infections. Performed at [facility], [place] [address], [place], [place] [address]  Radiology Studies: No results found.      Scheduled Meds: . vitamin C  500 mg Oral Daily  . atenolol  25 mg Oral Daily  . benztropine  0.5 mg Oral Daily  . chlorhexidine  15 mL Mouth Rinse BID  . Chlorhexidine Gluconate Cloth  6 each Topical Daily  . dexamethasone (DECADRON) injection  6 mg Intravenous QHS  . dextromethorphan-guaiFENesin  1 tablet Oral BID  . divalproex  250 mg Oral BID  . donepezil  5 mg Oral QHS  . enoxaparin (LOVENOX) injection  40 mg Subcutaneous Q12H  . famotidine  20 mg Oral BID  . Ipratropium-Albuterol  1 puff Inhalation QID  . mouth rinse  15 mL Mouth Rinse q12n4p  . risperiDONE  1 mg Oral QHS  . zinc sulfate  220 mg Oral Daily   Continuous Infusions: . phenylephrine (NEO-SYNEPHRINE) Adult infusion Stopped (07/13/19 0331)     LOS: 9 days   The patient is critically ill with multiple organ systems failure and requires high complexity decision making for assessment and support, frequent evaluation and titration of therapies, application of advanced monitoring technologies and extensive interpretation of multiple databases. Critical Care Time devoted to patient care services described in this note  Time spent: 40 minutes     Vihaan Gloss, Geraldo Docker, MD [facility] Pager [phone]  If 7PM-7AM, please contact night-coverage [email or web address] Password [email or web address] 07/20/2019, 11:11 AM

## 2019-07-20 NOTE — Progress Notes (Signed)
Uneventful shift. Patient remains on 1 lpm [place]. Attempted to wean off o2 this shift. Each time that o2 was stopped the patient's o2 sat would drop to 88. No falls or injuries this shift. No acute cardiac or respiratory issues this shift. Patient remains alert and oriented x 2-3. Foley intact. Call bell within reach.

## 2019-07-20 NOTE — Plan of Care (Signed)
  Problem: Education: Goal: Knowledge of risk factors and measures for prevention of condition will improve Outcome: Progressing   Problem: Coping: Goal: Psychosocial and spiritual needs will be supported Outcome: Progressing   Problem: Respiratory: Goal: Will maintain a patent airway Outcome: Progressing Goal: Complications related to the disease process, condition or treatment will be avoided or minimized Outcome: Progressing   

## 2019-07-20 NOTE — Progress Notes (Signed)
Daughter Liborio Nixon) updated on current condition and discharge status.

## 2019-07-21 DIAGNOSIS — E669 Obesity, unspecified: Secondary | ICD-10-CM

## 2019-07-21 NOTE — Progress Notes (Signed)
PROGRESS NOTE  Isabella Campbell WER:[id number] DOB: 27-Aug-1917 DOA: 07/11/2019 PCP: Ignatius Specking, MD  HPI/Recap of past 98 hours: 86 year old female from assisted living with history of bipolar disorder, pulmonary hypertension, COPD and recently diagnosed UTI presented with altered mental status and hypoxia on 12/24 to the emergency room and was admitted for Covid pneumonia.  She was also felt to be septic secondary to UTI over the next few hours following admission, patient's hypoxia worsened and she was then intubated and transferred to the ICU.  Treated with IV Remdisivir, antibiotics and steroids.  Patient able to be extubated by 12/26.  Sepsis secondary to UTI stabilized.  Over the next few days, patient has continued to improve.  Seen by speech therapy and passed swallow evaluation.  Seen by physical and Occupational Therapy and felt to need skilled nursing (previously from assisted living).  Patient is currently on 3 L nasal cannula.  She only complains of her backside hurting because of being uncomfortable in the patient bed.   Assessment/Plan: Principal Problem: Acute respiratory failure with hypoxia secondary to ARDS from COVID-19 virus infection/chronic COPD: Significantly improved.  Treated with IV remdesivir and steroids.  Patient has been currently weaned down to 1-2 L nasal cannula.  Continue to try to wean further Active Problems:   Essential hypertension    Altered encephalopathy in patient with underlying bipolar disorder: Appears to be back at baseline.  Continue home medications.    Pressure injury of skin: Present on admission  Obesity: Patient meets criteria for BMI greater than 30  UTI: Initially patient thought to have sepsis, but sepsis has been since ruled out.  Completed course of antibiotics  Code Status: DNR  Family Communication: Left message for son  Disposition Plan: Skilled nursing once bed approved   Consultants:  Critical  care  Procedures:  Intubation 12/24-12/26  Antimicrobials:  IV Remdisivir 12/25-12/30  IV Rocephin 12/24-12/26  IV Zithromax 12/24-12/26  IV clindamycin 12/25 x 1 dose  DVT prophylaxis: Lovenox   Objective: Vitals:   07/21/19 1700 07/21/19 1800  BP:    Pulse: 77 80  Resp:    Temp:    SpO2: 94% 98%    Intake/Output Summary (Last 24 hours) at 07/21/2019 1851 Last data filed at 07/21/2019 1800 Gross per 24 hour  Intake 1346 ml  Output 750 ml  Net 596 ml   Filed Weights   07/12/19 0320  Weight: 82.1 kg   Body mass index is 30.12 kg/m.  Exam:   General: Alert and oriented x2, no acute distress  Cardiovascular: Regular rate and rhythm, S1-S2  Respiratory: Clear to auscultation bilaterally  Abdomen: Soft, obese, nontender, positive bowel sounds  Musculoskeletal: No clubbing or cyanosis or edema   Data Reviewed: CBC: Recent Labs  Lab 07/15/19 0600 07/16/19 0230 07/17/19 0120 07/18/19 0010 07/19/19 0908  WBC 4.7 5.1 5.9 8.4 7.6  NEUTROABS 3.6 3.8 4.3 6.1 5.6  HGB 13.7 12.4 12.5 12.6 13.3  HCT 42.9 38.0 38.6 39.0 41.1  MCV 93.1 92.0 92.8 92.0 91.5  PLT 275 304 312 356 391   Basic Metabolic Panel: Recent Labs  Lab 07/15/19 0600 07/16/19 0230 07/17/19 0120 07/18/19 0010 07/19/19 0908  NA 142 142 142 140 143  K 4.8 4.3 4.7 4.4 4.4  CL 106 108 108 105 107  CO2 23 27 25 25 26   GLUCOSE 182* 168* 192* 128* 161*  BUN 38* 41* 41* 37* 37*  CREATININE 0.82 0.73 0.84 0.79 0.74  CALCIUM 8.2* 8.3*  8.4* 8.6* 8.9  MG 2.1 2.1 2.0 1.8 1.9  PHOS 3.3 3.1 3.1 2.6 4.0   GFR: Estimated Creatinine Clearance: 40.5 mL/min (by C-G formula based on SCr of 0.74 mg/dL). Liver Function Tests: Recent Labs  Lab 07/15/19 0600 07/16/19 0230 07/17/19 0120 07/18/19 0010 07/19/19 0908  AST 54* 78* 59* 33 24  ALT 25 45* 47* 37 31  ALKPHOS 51 49 45 45 52  BILITOT 0.6 0.9 0.7 0.6 0.9  PROT 5.6* 5.3* 5.2* 5.5* 5.9*  ALBUMIN 2.5* 2.6* 2.5* 2.7* 2.7*   No results  for input(s): LIPASE, AMYLASE in the last 168 hours. No results for input(s): AMMONIA in the last 168 hours. Coagulation Profile: No results for input(s): INR, PROTIME in the last 168 hours. Cardiac Enzymes: No results for input(s): CKTOTAL, CKMB, CKMBINDEX, TROPONINI in the last 168 hours. BNP (last 3 results) No results for input(s): PROBNP in the last 8760 hours. HbA1C: No results for input(s): HGBA1C in the last 72 hours. CBG: No results for input(s): GLUCAP in the last 168 hours. Lipid Profile: No results for input(s): CHOL, HDL, LDLCALC, TRIG, CHOLHDL, LDLDIRECT in the last 72 hours. Thyroid Function Tests: No results for input(s): TSH, T4TOTAL, FREET4, T3FREE, THYROIDAB in the last 72 hours. Anemia Panel: No results for input(s): VITAMINB12, FOLATE, FERRITIN, TIBC, IRON, RETICCTPCT in the last 72 hours. Urine analysis: No results found for: COLORURINE, APPEARANCEUR, LABSPEC, PHURINE, GLUCOSEU, HGBUR, BILIRUBINUR, KETONESUR, PROTEINUR, UROBILINOGEN, NITRITE, LEUKOCYTESUR Sepsis Labs: @LABRCNTIP (procalcitonin:4,lacticidven:4)  ) Recent Results (from the past 240 hour(s))  Blood Culture (routine x 2)     Status: None   Collection Time: 07/11/19  7:08 PM   Specimen: Right Antecubital; Blood  Result Value Ref Range Status   Specimen Description RIGHT ANTECUBITAL  Final   Special Requests   Final    BOTTLES DRAWN AEROBIC AND ANAEROBIC Blood Culture adequate volume   Culture   Final    NO GROWTH 5 DAYS Performed at [facility], [address] [place], [place]    Report Status 07/16/2019 FINAL  Final  Blood Culture (routine x 2)     Status: None   Collection Time: 07/11/19  7:08 PM   Specimen: Left Antecubital; Blood  Result Value Ref Range Status   Specimen Description LEFT ANTECUBITAL  Final   Special Requests   Final    BOTTLES DRAWN AEROBIC AND ANAEROBIC Blood Culture adequate volume   Culture   Final    NO GROWTH 5 DAYS Performed at [facility],  [address], [place], [place]    Report Status 07/16/2019 FINAL  Final  MRSA PCR Screening     Status: None   Collection Time: 07/12/19  5:51 AM   Specimen: Nasopharyngeal  Result Value Ref Range Status   MRSA by PCR NEGATIVE NEGATIVE Final    Comment:        The GeneXpert MRSA Assay (FDA approved for NASAL specimens only), is one component of a comprehensive MRSA colonization surveillance program. It is not intended to diagnose MRSA infection nor to guide or monitor treatment for MRSA infections. Performed at [facility], [address], [place], [place]       Studies: No results found.  Scheduled Meds: . vitamin C  500 mg Oral Daily  . atenolol  25 mg Oral Daily  . benztropine  0.5 mg Oral Daily  . chlorhexidine  15 mL Mouth Rinse BID  . Chlorhexidine Gluconate Cloth  6 each Topical Daily  .  dextromethorphan-guaiFENesin  1 tablet Oral BID  . divalproex  250 mg Oral BID  . donepezil  5 mg Oral QHS  . enoxaparin (LOVENOX) injection  40 mg Subcutaneous Q12H  . famotidine  20 mg Oral BID  . Ipratropium-Albuterol  1 puff Inhalation QID  . mouth rinse  15 mL Mouth Rinse q12n4p  . risperiDONE  1 mg Oral QHS  . zinc sulfate  220 mg Oral Daily    Continuous Infusions: . phenylephrine (NEO-SYNEPHRINE) Adult infusion Stopped (07/13/19 0331)     LOS: 10 days     Annita Brod, MD [facility]  To reach me or the doctor on call, go to: [email or web address] Password [facility]  07/21/2019, 6:51 PM

## 2019-07-21 NOTE — Plan of Care (Signed)
  Problem: Education: Goal: Knowledge of risk factors and measures for prevention of condition will improve Outcome: Progressing   Problem: Coping: Goal: Psychosocial and spiritual needs will be supported Outcome: Progressing   Problem: Respiratory: Goal: Will maintain a patent airway Outcome: Progressing Goal: Complications related to the disease process, condition or treatment will be avoided or minimized Outcome: Progressing   

## 2019-07-21 NOTE — Progress Notes (Signed)
Daughter Liborio Nixon) updated on current status/condition and discharge plan.

## 2019-07-21 NOTE — Progress Notes (Signed)
Uneventful shift. Patient remains on 2lpm [place] and O2 sats maintain in the mid 90's at that rate. Patient remains alert and oriented x 2-3. No acute respiratory or cardiac episodes this shift. No falls or injuries this shift. Foley catheter remains intact draining clear yellow urine. Patient resting comfortably in bed in the sitting position watching TV. Call bell within reach. Will continue to monitor.

## 2019-07-22 DIAGNOSIS — R4182 Altered mental status, unspecified: Secondary | ICD-10-CM | POA: Diagnosis not present

## 2019-07-22 DIAGNOSIS — R410 Disorientation, unspecified: Secondary | ICD-10-CM | POA: Diagnosis not present

## 2019-07-22 DIAGNOSIS — F319 Bipolar disorder, unspecified: Secondary | ICD-10-CM | POA: Diagnosis not present

## 2019-07-22 DIAGNOSIS — J449 Chronic obstructive pulmonary disease, unspecified: Secondary | ICD-10-CM | POA: Diagnosis not present

## 2019-07-22 DIAGNOSIS — I10 Essential (primary) hypertension: Secondary | ICD-10-CM | POA: Diagnosis not present

## 2019-07-22 DIAGNOSIS — Z7401 Bed confinement status: Secondary | ICD-10-CM | POA: Diagnosis not present

## 2019-07-22 DIAGNOSIS — M6281 Muscle weakness (generalized): Secondary | ICD-10-CM | POA: Diagnosis not present

## 2019-07-22 DIAGNOSIS — J9601 Acute respiratory failure with hypoxia: Secondary | ICD-10-CM | POA: Diagnosis not present

## 2019-07-22 DIAGNOSIS — I959 Hypotension, unspecified: Secondary | ICD-10-CM | POA: Diagnosis not present

## 2019-07-22 DIAGNOSIS — U071 COVID-19: Secondary | ICD-10-CM | POA: Diagnosis not present

## 2019-07-22 DIAGNOSIS — N390 Urinary tract infection, site not specified: Secondary | ICD-10-CM | POA: Diagnosis not present

## 2019-07-22 DIAGNOSIS — R262 Difficulty in walking, not elsewhere classified: Secondary | ICD-10-CM | POA: Diagnosis not present

## 2019-07-22 DIAGNOSIS — M2550 Pain in unspecified joint: Secondary | ICD-10-CM | POA: Diagnosis not present

## 2019-07-22 MED ORDER — IPRATROPIUM-ALBUTEROL 20-100 MCG/ACT IN AERS: 1.0000 | INHALATION_SPRAY | Freq: Four times a day (QID) | RESPIRATORY_TRACT | 0 refills | Status: DC

## 2019-07-22 NOTE — Discharge Summary (Addendum)
Discharge Summary  Isabella Campbell Gatt JTT:[id number] DOB: 06-24-1918  PCP: Ignatius Specking, MD  Admit date: 07/11/2019 Discharge date: 07/22/2019  Time spent: 25 minutes  Recommendations for Outpatient Follow-up:  1. Medication change: Patient's Cozaar 100 mg daily put on hold due to softer blood pressures.  Should her blood pressure start to persistently stay elevated above 140, would restart this medication. 2. Nutritional supplement added: Ensure Enlive 2X daily between meals 3. Patient being discharged to [facility] skilled nursing facility 4. New medication: Combivent inhaler 4 times a day for the next 1 month.  Then can be stopped. 5. Oxygen 1-2 L nasal cannula.  This can be assessed and attempted to wean off at her nursing facility. 6. Patient tested positive for Covid on 12/24.  She should remain in isolation and is felt to no longer be contagious as of 1/14.  Discharge Diagnoses:  Active Hospital Problems   Diagnosis Date Noted  . COVID-19 virus infection 07/11/2019  . Obesity (BMI 30-39.9) 07/21/2019  . Bipolar 1 disorder (HCC) 07/15/2019  . Acute respiratory failure with hypoxia (HCC) 07/12/2019  . Altered mental status 07/12/2019  . Pressure injury of skin 07/12/2019  . COPD (chronic obstructive pulmonary disease) (HCC) 05/06/2009  . Essential hypertension 04/15/2009    Resolved Hospital Problems  No resolved problems to display.    Discharge Condition: Improved, being discharged to skilled nursing  Diet recommendation: Regular diet, unrestricted plus Ensure Enlive twice daily between meals  Vitals:   07/22/19 0701 07/22/19 0800  BP:  (!) 109/58  Pulse:  62  Resp:  20  Temp:  98.2 F (36.8 C)  SpO2: 98% 97%    History of present illness:  86 year old female from assisted living with history of bipolar disorder, pulmonary hypertension, COPD and recently diagnosed UTI presented with altered mental status and hypoxia on 12/24 to the emergency room and was  admitted for Covid pneumonia.    There is a concern that she was septic(later ruled out) secondary to UTI over the next few hours following admission, and patient's hypoxia worsened and she was then intubated and transferred to the ICU.    Hospital Course:  Principal Problem: Acute respiratory failure with hypoxia secondary to ARDS from COVID-19 virus infection/chronic COPD: Patient improved, able to be extubated by 12/26.  Treated with IV remdesivir and steroids.  Patient has been currently weaned down to 1-2 L nasal cannula.    Should be discharged on oxygen 1-2 L and this can be weaned off at the nursing facility.  We will also discharged on Combivent 4 times a day for the next month and then that can be stopped as well. Active Problems:   Essential hypertension: Patient's medications restarted and patient's blood pressure has been slightly soft around 100.  This is without her as needed Lasix or Cozaar 100 mg p.o. daily.  Would recommend that Cozaar continue to be held at this time.  Should her blood pressure start to persistently stay above 140, Cozaar can be restarted.    Altered encephalopathy in patient with underlying bipolar disorder: Secondary to infection, hypoxia and illness.  Appears to be back at baseline.  Continue home medications.    Pressure injury of skin: Stage I decubitus ulcer.  Present on admission  Obesity: Patient meets criteria for BMI greater than 30  UTI: Initially patient thought to have sepsis, but sepsis has been since ruled out.  Completed course of antibiotics  Consultants:  Critical care  Procedures:  Intubation 12/24-12/26  Discharge Exam: BP (!) 109/58 (BP Location: Right Arm)   Pulse 62   Temp 98.2 F (36.8 C) (Oral)   Resp 20   Ht 5\' 5"  (1.651 m)   Wt 82.1 kg   SpO2 97%   BMI 30.12 kg/m   General: Alert and oriented x2, no acute distress Cardiovascular: Regular rate and rhythm, S1-S2 Respiratory: Clear to auscultation bilaterally,  moderate inspiratory effort  Discharge Instructions You were cared for by a hospitalist during your hospital stay. If you have any questions about your discharge medications or the care you received while you were in the hospital after you are discharged, you can call the unit and asked to speak with the hospitalist on call if the hospitalist that took care of you is not available. Once you are discharged, your primary care physician will handle any further medical issues. Please note that NO REFILLS for any discharge medications will be authorized once you are discharged, as it is imperative that you return to your primary care physician (or establish a relationship with a primary care physician if you do not have one) for your aftercare needs so that they can reassess your need for medications and monitor your lab values.  Discharge Instructions    Diet regular   Complete by: As directed    Increase activity slowly   Complete by: As directed      Allergies as of 07/22/2019      Reactions   Doxycycline Hives   Penicillins Hives   Quetiapine    REACTION: involuntary licking of tongue against teeth, felt really bad on med   Sulfonamide Derivatives Nausea And Vomiting      Medication List    STOP taking these medications   ciprofloxacin 500 MG tablet Commonly known as: CIPRO   losartan 100 MG tablet Commonly known as: COZAAR     TAKE these medications   acetaminophen 325 MG tablet Commonly known as: TYLENOL Take 650 mg by mouth every 6 (six) hours as needed.   atenolol 25 MG tablet Commonly known as: TENORMIN Take 25 mg by mouth daily.   benztropine 0.5 MG tablet Commonly known as: COGENTIN Take 0.5 mg by mouth daily.   Cholecalciferol 25 MCG (1000 UT) tablet Take 1,000 Units by mouth.   Coenzyme Q10 100 MG capsule Take 100 mg by mouth 3 (three) times daily.   divalproex 250 MG DR tablet Commonly known as: DEPAKOTE Take 250 mg by mouth 2 (two) times daily.     donepezil 5 MG tablet Commonly known as: ARICEPT Take 5 mg by mouth at bedtime.   Eye Vitamins & Minerals Tabs Take 1 tablet by mouth daily.   furosemide 20 MG tablet Commonly known as: LASIX Take 1 tablet by mouth daily as needed for fluid.   gabapentin 100 MG capsule Commonly known as: NEURONTIN Take 100 mg by mouth at bedtime.   Ipratropium-Albuterol 20-100 MCG/ACT Aers respimat Commonly known as: COMBIVENT Inhale 1 puff into the lungs 4 (four) times daily.   multivitamin tablet Take 1 tablet by mouth daily.   potassium chloride 10 MEQ tablet Commonly known as: KLOR-CON Take 1 tablet by mouth daily as needed (for fluid retention when taking Lasix (Furosemide)).   risperiDONE 1 MG tablet Commonly known as: RISPERDAL Take 1 mg by mouth at bedtime.   traZODone 50 MG tablet Commonly known as: DESYREL Take 50 mg by mouth at bedtime.   Turmeric 500 MG Caps Take 1 capsule by mouth daily.  vitamin C 1000 MG tablet Take 1,000 mg by mouth daily.      Allergies  Allergen Reactions  . Doxycycline Hives  . Penicillins Hives  . Quetiapine     REACTION: involuntary licking of tongue against teeth, felt really bad on med  . Sulfonamide Derivatives Nausea And Vomiting      The results of significant diagnostics from this hospitalization (including imaging, microbiology, ancillary and laboratory) are listed below for reference.    Significant Diagnostic Studies: DG Chest Port 1 View  Result Date: 07/12/2019 CLINICAL DATA:  Intubation EXAM: PORTABLE CHEST 1 VIEW COMPARISON:  07/11/2019 FINDINGS: Endotracheal tube tip is at the carina. The tip is very near the right mainstem bronchus. There is increased airspace opacity in the right lung. Orogastric tube tip and side port are below the field of view. IMPRESSION: 1. Endotracheal tube tip at the carina. Retraction by 3 cm would place it at the level of the clavicular heads. 2. Increased airspace opacity in the right lung,  concerning for pneumonia. 3. Orogastric tube tip and side port below the field view. Electronically Signed   By: Ulyses Jarred M.D.   On: 07/12/2019 03:03   DG Chest Port 1 View  Result Date: 07/11/2019 CLINICAL DATA:  UTI and sepsis EXAM: PORTABLE CHEST 1 VIEW COMPARISON:  None. FINDINGS: Lungs are mildly hyperinflated. There is bibasilar scarring without focal consolidation. No pneumothorax or sizable pleural effusion. Mild cardiomegaly. Diffuse interstitial coarsening. IMPRESSION: COPD without acute airspace disease. Electronically Signed   By: Ulyses Jarred M.D.   On: 07/11/2019 19:22    Microbiology: No results found for this or any previous visit (from the past 240 hour(s)).   Labs: Basic Metabolic Panel: Recent Labs  Lab 07/16/19 0230 07/17/19 0120 07/18/19 0010 07/19/19 0908  NA 142 142 140 143  K 4.3 4.7 4.4 4.4  CL 108 108 105 107  CO2 27 25 25 26   GLUCOSE 168* 192* 128* 161*  BUN 41* 41* 37* 37*  CREATININE 0.73 0.84 0.79 0.74  CALCIUM 8.3* 8.4* 8.6* 8.9  MG 2.1 2.0 1.8 1.9  PHOS 3.1 3.1 2.6 4.0   Liver Function Tests: Recent Labs  Lab 07/16/19 0230 07/17/19 0120 07/18/19 0010 07/19/19 0908  AST 78* 59* 33 24  ALT 45* 47* 37 31  ALKPHOS 49 45 45 52  BILITOT 0.9 0.7 0.6 0.9  PROT 5.3* 5.2* 5.5* 5.9*  ALBUMIN 2.6* 2.5* 2.7* 2.7*   No results for input(s): LIPASE, AMYLASE in the last 168 hours. No results for input(s): AMMONIA in the last 168 hours. CBC: Recent Labs  Lab 07/16/19 0230 07/17/19 0120 07/18/19 0010 07/19/19 0908  WBC 5.1 5.9 8.4 7.6  NEUTROABS 3.8 4.3 6.1 5.6  HGB 12.4 12.5 12.6 13.3  HCT 38.0 38.6 39.0 41.1  MCV 92.0 92.8 92.0 91.5  PLT 304 312 356 391   Cardiac Enzymes: No results for input(s): CKTOTAL, CKMB, CKMBINDEX, TROPONINI in the last 168 hours. BNP: BNP (last 3 results) No results for input(s): BNP in the last 8760 hours.  ProBNP (last 3 results) No results for input(s): PROBNP in the last 8760 hours.  CBG: No  results for input(s): GLUCAP in the last 168 hours.     Signed:  Annita Brod, MD [facility] 07/22/2019, 12:09 PM

## 2019-07-22 NOTE — Progress Notes (Signed)
Attempted to call report x 4 to [facility] at this number [phone]) provided by the social worker.

## 2019-07-22 NOTE — TOC Transition Note (Signed)
Transition of Care [facility]) - CM/SW Discharge Note   Patient Details  Name: TIANDRA SWOVELAND MRN: [id number] Date of Birth: [birth year]  Transition of Care [facility]) CM/SW Contact:  Donnie Coffin, LCSW Phone Number: 07/22/2019, 1:00 PM   Clinical Narrative:     Patient set to go to [facility]- please call report to [phone]. [facility] called for transportation- Daughter Liborio Nixon notified and voiced no concerns.     Barriers to Discharge: Continued Medical Work up   Patient Goals and CMS Choice        Discharge Placement                       Discharge Plan and Services In-house Referral: Clinical Social Work                                   Social Determinants of Health (SDOH) Interventions     Readmission Risk Interventions Readmission Risk Prevention Plan 07/15/2019  Transportation Screening Complete  Medication Review (RN CM) Complete  Some recent data might be hidden

## 2019-07-24 DIAGNOSIS — I10 Essential (primary) hypertension: Secondary | ICD-10-CM | POA: Diagnosis not present

## 2019-07-24 DIAGNOSIS — U071 COVID-19: Secondary | ICD-10-CM | POA: Diagnosis not present

## 2019-07-24 DIAGNOSIS — J449 Chronic obstructive pulmonary disease, unspecified: Secondary | ICD-10-CM | POA: Diagnosis not present

## 2019-08-22 DIAGNOSIS — I10 Essential (primary) hypertension: Secondary | ICD-10-CM | POA: Diagnosis not present

## 2019-08-22 DIAGNOSIS — J449 Chronic obstructive pulmonary disease, unspecified: Secondary | ICD-10-CM | POA: Diagnosis not present

## 2019-08-22 DIAGNOSIS — N390 Urinary tract infection, site not specified: Secondary | ICD-10-CM | POA: Diagnosis not present

## 2019-09-20 DIAGNOSIS — U071 COVID-19: Secondary | ICD-10-CM | POA: Diagnosis not present

## 2019-09-20 DIAGNOSIS — J449 Chronic obstructive pulmonary disease, unspecified: Secondary | ICD-10-CM | POA: Diagnosis not present

## 2019-09-20 DIAGNOSIS — I10 Essential (primary) hypertension: Secondary | ICD-10-CM | POA: Diagnosis not present

## 2019-09-20 DIAGNOSIS — F319 Bipolar disorder, unspecified: Secondary | ICD-10-CM | POA: Diagnosis not present

## 2019-10-17 DIAGNOSIS — J9601 Acute respiratory failure with hypoxia: Secondary | ICD-10-CM | POA: Diagnosis not present

## 2019-10-17 DIAGNOSIS — F319 Bipolar disorder, unspecified: Secondary | ICD-10-CM | POA: Diagnosis not present

## 2019-10-17 DIAGNOSIS — J449 Chronic obstructive pulmonary disease, unspecified: Secondary | ICD-10-CM | POA: Diagnosis not present

## 2019-10-17 DIAGNOSIS — I10 Essential (primary) hypertension: Secondary | ICD-10-CM | POA: Diagnosis not present

## 2019-11-18 DIAGNOSIS — I10 Essential (primary) hypertension: Secondary | ICD-10-CM | POA: Diagnosis not present

## 2019-11-18 DIAGNOSIS — F0390 Unspecified dementia without behavioral disturbance: Secondary | ICD-10-CM | POA: Diagnosis not present

## 2019-11-18 DIAGNOSIS — J449 Chronic obstructive pulmonary disease, unspecified: Secondary | ICD-10-CM | POA: Diagnosis not present

## 2019-11-20 DIAGNOSIS — J449 Chronic obstructive pulmonary disease, unspecified: Secondary | ICD-10-CM | POA: Diagnosis not present

## 2019-11-20 DIAGNOSIS — I10 Essential (primary) hypertension: Secondary | ICD-10-CM | POA: Diagnosis not present

## 2019-11-20 DIAGNOSIS — U071 COVID-19: Secondary | ICD-10-CM | POA: Diagnosis not present

## 2019-11-20 DIAGNOSIS — F0390 Unspecified dementia without behavioral disturbance: Secondary | ICD-10-CM | POA: Diagnosis not present

## 2019-12-13 DIAGNOSIS — I10 Essential (primary) hypertension: Secondary | ICD-10-CM | POA: Diagnosis not present

## 2019-12-13 DIAGNOSIS — F0390 Unspecified dementia without behavioral disturbance: Secondary | ICD-10-CM | POA: Diagnosis not present

## 2019-12-13 DIAGNOSIS — J449 Chronic obstructive pulmonary disease, unspecified: Secondary | ICD-10-CM | POA: Diagnosis not present

## 2019-12-13 DIAGNOSIS — J9601 Acute respiratory failure with hypoxia: Secondary | ICD-10-CM | POA: Diagnosis not present

## 2020-01-30 DIAGNOSIS — J449 Chronic obstructive pulmonary disease, unspecified: Secondary | ICD-10-CM | POA: Diagnosis not present

## 2020-01-30 DIAGNOSIS — I10 Essential (primary) hypertension: Secondary | ICD-10-CM | POA: Diagnosis not present

## 2020-01-30 DIAGNOSIS — F0390 Unspecified dementia without behavioral disturbance: Secondary | ICD-10-CM | POA: Diagnosis not present

## 2020-01-30 DIAGNOSIS — F319 Bipolar disorder, unspecified: Secondary | ICD-10-CM | POA: Diagnosis not present

## 2020-02-06 DIAGNOSIS — Z23 Encounter for immunization: Secondary | ICD-10-CM | POA: Diagnosis not present

## 2020-02-20 DIAGNOSIS — J449 Chronic obstructive pulmonary disease, unspecified: Secondary | ICD-10-CM | POA: Diagnosis not present

## 2020-02-20 DIAGNOSIS — R419 Unspecified symptoms and signs involving cognitive functions and awareness: Secondary | ICD-10-CM | POA: Diagnosis not present

## 2020-02-20 DIAGNOSIS — R41841 Cognitive communication deficit: Secondary | ICD-10-CM | POA: Diagnosis not present

## 2020-02-20 DIAGNOSIS — M6281 Muscle weakness (generalized): Secondary | ICD-10-CM | POA: Diagnosis not present

## 2020-02-20 DIAGNOSIS — R262 Difficulty in walking, not elsewhere classified: Secondary | ICD-10-CM | POA: Diagnosis not present

## 2020-02-21 DIAGNOSIS — J449 Chronic obstructive pulmonary disease, unspecified: Secondary | ICD-10-CM | POA: Diagnosis not present

## 2020-02-21 DIAGNOSIS — R419 Unspecified symptoms and signs involving cognitive functions and awareness: Secondary | ICD-10-CM | POA: Diagnosis not present

## 2020-02-21 DIAGNOSIS — R41841 Cognitive communication deficit: Secondary | ICD-10-CM | POA: Diagnosis not present

## 2020-02-21 DIAGNOSIS — R262 Difficulty in walking, not elsewhere classified: Secondary | ICD-10-CM | POA: Diagnosis not present

## 2020-02-21 DIAGNOSIS — M6281 Muscle weakness (generalized): Secondary | ICD-10-CM | POA: Diagnosis not present

## 2020-02-24 DIAGNOSIS — J449 Chronic obstructive pulmonary disease, unspecified: Secondary | ICD-10-CM | POA: Diagnosis not present

## 2020-02-24 DIAGNOSIS — R419 Unspecified symptoms and signs involving cognitive functions and awareness: Secondary | ICD-10-CM | POA: Diagnosis not present

## 2020-02-24 DIAGNOSIS — R41841 Cognitive communication deficit: Secondary | ICD-10-CM | POA: Diagnosis not present

## 2020-02-24 DIAGNOSIS — R262 Difficulty in walking, not elsewhere classified: Secondary | ICD-10-CM | POA: Diagnosis not present

## 2020-02-24 DIAGNOSIS — M6281 Muscle weakness (generalized): Secondary | ICD-10-CM | POA: Diagnosis not present

## 2020-02-25 DIAGNOSIS — J449 Chronic obstructive pulmonary disease, unspecified: Secondary | ICD-10-CM | POA: Diagnosis not present

## 2020-02-25 DIAGNOSIS — R419 Unspecified symptoms and signs involving cognitive functions and awareness: Secondary | ICD-10-CM | POA: Diagnosis not present

## 2020-02-25 DIAGNOSIS — R262 Difficulty in walking, not elsewhere classified: Secondary | ICD-10-CM | POA: Diagnosis not present

## 2020-02-25 DIAGNOSIS — M6281 Muscle weakness (generalized): Secondary | ICD-10-CM | POA: Diagnosis not present

## 2020-02-25 DIAGNOSIS — R41841 Cognitive communication deficit: Secondary | ICD-10-CM | POA: Diagnosis not present

## 2020-02-26 DIAGNOSIS — R419 Unspecified symptoms and signs involving cognitive functions and awareness: Secondary | ICD-10-CM | POA: Diagnosis not present

## 2020-02-26 DIAGNOSIS — R262 Difficulty in walking, not elsewhere classified: Secondary | ICD-10-CM | POA: Diagnosis not present

## 2020-02-26 DIAGNOSIS — J449 Chronic obstructive pulmonary disease, unspecified: Secondary | ICD-10-CM | POA: Diagnosis not present

## 2020-02-26 DIAGNOSIS — M6281 Muscle weakness (generalized): Secondary | ICD-10-CM | POA: Diagnosis not present

## 2020-02-26 DIAGNOSIS — R41841 Cognitive communication deficit: Secondary | ICD-10-CM | POA: Diagnosis not present

## 2020-02-27 DIAGNOSIS — R419 Unspecified symptoms and signs involving cognitive functions and awareness: Secondary | ICD-10-CM | POA: Diagnosis not present

## 2020-02-27 DIAGNOSIS — R41841 Cognitive communication deficit: Secondary | ICD-10-CM | POA: Diagnosis not present

## 2020-02-27 DIAGNOSIS — R262 Difficulty in walking, not elsewhere classified: Secondary | ICD-10-CM | POA: Diagnosis not present

## 2020-02-27 DIAGNOSIS — J449 Chronic obstructive pulmonary disease, unspecified: Secondary | ICD-10-CM | POA: Diagnosis not present

## 2020-02-27 DIAGNOSIS — M6281 Muscle weakness (generalized): Secondary | ICD-10-CM | POA: Diagnosis not present

## 2020-02-28 DIAGNOSIS — R41841 Cognitive communication deficit: Secondary | ICD-10-CM | POA: Diagnosis not present

## 2020-02-28 DIAGNOSIS — F319 Bipolar disorder, unspecified: Secondary | ICD-10-CM | POA: Diagnosis not present

## 2020-02-28 DIAGNOSIS — M6281 Muscle weakness (generalized): Secondary | ICD-10-CM | POA: Diagnosis not present

## 2020-02-28 DIAGNOSIS — J449 Chronic obstructive pulmonary disease, unspecified: Secondary | ICD-10-CM | POA: Diagnosis not present

## 2020-02-28 DIAGNOSIS — R262 Difficulty in walking, not elsewhere classified: Secondary | ICD-10-CM | POA: Diagnosis not present

## 2020-02-28 DIAGNOSIS — F0390 Unspecified dementia without behavioral disturbance: Secondary | ICD-10-CM | POA: Diagnosis not present

## 2020-02-28 DIAGNOSIS — I10 Essential (primary) hypertension: Secondary | ICD-10-CM | POA: Diagnosis not present

## 2020-02-28 DIAGNOSIS — R419 Unspecified symptoms and signs involving cognitive functions and awareness: Secondary | ICD-10-CM | POA: Diagnosis not present

## 2020-02-29 DIAGNOSIS — M6281 Muscle weakness (generalized): Secondary | ICD-10-CM | POA: Diagnosis not present

## 2020-02-29 DIAGNOSIS — J449 Chronic obstructive pulmonary disease, unspecified: Secondary | ICD-10-CM | POA: Diagnosis not present

## 2020-02-29 DIAGNOSIS — R419 Unspecified symptoms and signs involving cognitive functions and awareness: Secondary | ICD-10-CM | POA: Diagnosis not present

## 2020-02-29 DIAGNOSIS — R262 Difficulty in walking, not elsewhere classified: Secondary | ICD-10-CM | POA: Diagnosis not present

## 2020-02-29 DIAGNOSIS — R41841 Cognitive communication deficit: Secondary | ICD-10-CM | POA: Diagnosis not present

## 2020-03-02 DIAGNOSIS — J449 Chronic obstructive pulmonary disease, unspecified: Secondary | ICD-10-CM | POA: Diagnosis not present

## 2020-03-02 DIAGNOSIS — M6281 Muscle weakness (generalized): Secondary | ICD-10-CM | POA: Diagnosis not present

## 2020-03-02 DIAGNOSIS — R262 Difficulty in walking, not elsewhere classified: Secondary | ICD-10-CM | POA: Diagnosis not present

## 2020-03-02 DIAGNOSIS — R41841 Cognitive communication deficit: Secondary | ICD-10-CM | POA: Diagnosis not present

## 2020-03-02 DIAGNOSIS — R419 Unspecified symptoms and signs involving cognitive functions and awareness: Secondary | ICD-10-CM | POA: Diagnosis not present

## 2020-03-03 DIAGNOSIS — I10 Essential (primary) hypertension: Secondary | ICD-10-CM | POA: Diagnosis not present

## 2020-03-03 DIAGNOSIS — M6281 Muscle weakness (generalized): Secondary | ICD-10-CM | POA: Diagnosis not present

## 2020-03-03 DIAGNOSIS — J449 Chronic obstructive pulmonary disease, unspecified: Secondary | ICD-10-CM | POA: Diagnosis not present

## 2020-03-03 DIAGNOSIS — R41841 Cognitive communication deficit: Secondary | ICD-10-CM | POA: Diagnosis not present

## 2020-03-03 DIAGNOSIS — R532 Functional quadriplegia: Secondary | ICD-10-CM | POA: Diagnosis not present

## 2020-03-03 DIAGNOSIS — R419 Unspecified symptoms and signs involving cognitive functions and awareness: Secondary | ICD-10-CM | POA: Diagnosis not present

## 2020-03-03 DIAGNOSIS — F0390 Unspecified dementia without behavioral disturbance: Secondary | ICD-10-CM | POA: Diagnosis not present

## 2020-03-03 DIAGNOSIS — R262 Difficulty in walking, not elsewhere classified: Secondary | ICD-10-CM | POA: Diagnosis not present

## 2020-03-03 DIAGNOSIS — F319 Bipolar disorder, unspecified: Secondary | ICD-10-CM | POA: Diagnosis not present

## 2020-03-04 DIAGNOSIS — R41841 Cognitive communication deficit: Secondary | ICD-10-CM | POA: Diagnosis not present

## 2020-03-04 DIAGNOSIS — M6281 Muscle weakness (generalized): Secondary | ICD-10-CM | POA: Diagnosis not present

## 2020-03-04 DIAGNOSIS — J449 Chronic obstructive pulmonary disease, unspecified: Secondary | ICD-10-CM | POA: Diagnosis not present

## 2020-03-04 DIAGNOSIS — R262 Difficulty in walking, not elsewhere classified: Secondary | ICD-10-CM | POA: Diagnosis not present

## 2020-03-04 DIAGNOSIS — R419 Unspecified symptoms and signs involving cognitive functions and awareness: Secondary | ICD-10-CM | POA: Diagnosis not present

## 2020-03-05 DIAGNOSIS — R419 Unspecified symptoms and signs involving cognitive functions and awareness: Secondary | ICD-10-CM | POA: Diagnosis not present

## 2020-03-05 DIAGNOSIS — R262 Difficulty in walking, not elsewhere classified: Secondary | ICD-10-CM | POA: Diagnosis not present

## 2020-03-05 DIAGNOSIS — R41841 Cognitive communication deficit: Secondary | ICD-10-CM | POA: Diagnosis not present

## 2020-03-05 DIAGNOSIS — J449 Chronic obstructive pulmonary disease, unspecified: Secondary | ICD-10-CM | POA: Diagnosis not present

## 2020-03-05 DIAGNOSIS — M6281 Muscle weakness (generalized): Secondary | ICD-10-CM | POA: Diagnosis not present

## 2020-03-27 DIAGNOSIS — J449 Chronic obstructive pulmonary disease, unspecified: Secondary | ICD-10-CM | POA: Diagnosis not present

## 2020-03-27 DIAGNOSIS — F0390 Unspecified dementia without behavioral disturbance: Secondary | ICD-10-CM | POA: Diagnosis not present

## 2020-03-27 DIAGNOSIS — I10 Essential (primary) hypertension: Secondary | ICD-10-CM | POA: Diagnosis not present

## 2020-03-27 DIAGNOSIS — R532 Functional quadriplegia: Secondary | ICD-10-CM | POA: Diagnosis not present

## 2020-03-27 DIAGNOSIS — F319 Bipolar disorder, unspecified: Secondary | ICD-10-CM | POA: Diagnosis not present

## 2020-04-13 DIAGNOSIS — M2042 Other hammer toe(s) (acquired), left foot: Secondary | ICD-10-CM | POA: Diagnosis not present

## 2020-04-13 DIAGNOSIS — I7091 Generalized atherosclerosis: Secondary | ICD-10-CM | POA: Diagnosis not present

## 2020-04-13 DIAGNOSIS — B351 Tinea unguium: Secondary | ICD-10-CM | POA: Diagnosis not present

## 2020-04-13 DIAGNOSIS — M2012 Hallux valgus (acquired), left foot: Secondary | ICD-10-CM | POA: Diagnosis not present

## 2020-04-13 DIAGNOSIS — M2011 Hallux valgus (acquired), right foot: Secondary | ICD-10-CM | POA: Diagnosis not present

## 2020-04-13 DIAGNOSIS — M2041 Other hammer toe(s) (acquired), right foot: Secondary | ICD-10-CM | POA: Diagnosis not present

## 2020-04-17 DIAGNOSIS — I10 Essential (primary) hypertension: Secondary | ICD-10-CM | POA: Diagnosis not present

## 2020-05-08 DIAGNOSIS — K029 Dental caries, unspecified: Secondary | ICD-10-CM | POA: Diagnosis not present

## 2020-05-08 DIAGNOSIS — S025XXA Fracture of tooth (traumatic), initial encounter for closed fracture: Secondary | ICD-10-CM | POA: Diagnosis not present

## 2020-05-08 DIAGNOSIS — K068 Other specified disorders of gingiva and edentulous alveolar ridge: Secondary | ICD-10-CM | POA: Diagnosis not present

## 2020-05-19 DIAGNOSIS — Z23 Encounter for immunization: Secondary | ICD-10-CM | POA: Diagnosis not present

## 2020-05-21 DIAGNOSIS — J449 Chronic obstructive pulmonary disease, unspecified: Secondary | ICD-10-CM | POA: Diagnosis not present

## 2020-05-21 DIAGNOSIS — M6281 Muscle weakness (generalized): Secondary | ICD-10-CM | POA: Diagnosis not present

## 2020-05-21 DIAGNOSIS — R262 Difficulty in walking, not elsewhere classified: Secondary | ICD-10-CM | POA: Diagnosis not present

## 2020-05-22 DIAGNOSIS — R262 Difficulty in walking, not elsewhere classified: Secondary | ICD-10-CM | POA: Diagnosis not present

## 2020-05-22 DIAGNOSIS — M6281 Muscle weakness (generalized): Secondary | ICD-10-CM | POA: Diagnosis not present

## 2020-05-22 DIAGNOSIS — J449 Chronic obstructive pulmonary disease, unspecified: Secondary | ICD-10-CM | POA: Diagnosis not present

## 2020-05-25 DIAGNOSIS — R262 Difficulty in walking, not elsewhere classified: Secondary | ICD-10-CM | POA: Diagnosis not present

## 2020-05-25 DIAGNOSIS — M6281 Muscle weakness (generalized): Secondary | ICD-10-CM | POA: Diagnosis not present

## 2020-05-25 DIAGNOSIS — J449 Chronic obstructive pulmonary disease, unspecified: Secondary | ICD-10-CM | POA: Diagnosis not present

## 2020-05-26 DIAGNOSIS — R262 Difficulty in walking, not elsewhere classified: Secondary | ICD-10-CM | POA: Diagnosis not present

## 2020-05-26 DIAGNOSIS — M6281 Muscle weakness (generalized): Secondary | ICD-10-CM | POA: Diagnosis not present

## 2020-05-26 DIAGNOSIS — J449 Chronic obstructive pulmonary disease, unspecified: Secondary | ICD-10-CM | POA: Diagnosis not present

## 2020-05-27 DIAGNOSIS — M6281 Muscle weakness (generalized): Secondary | ICD-10-CM | POA: Diagnosis not present

## 2020-05-27 DIAGNOSIS — J449 Chronic obstructive pulmonary disease, unspecified: Secondary | ICD-10-CM | POA: Diagnosis not present

## 2020-05-27 DIAGNOSIS — R262 Difficulty in walking, not elsewhere classified: Secondary | ICD-10-CM | POA: Diagnosis not present

## 2020-05-28 DIAGNOSIS — J449 Chronic obstructive pulmonary disease, unspecified: Secondary | ICD-10-CM | POA: Diagnosis not present

## 2020-05-28 DIAGNOSIS — M6281 Muscle weakness (generalized): Secondary | ICD-10-CM | POA: Diagnosis not present

## 2020-05-28 DIAGNOSIS — R262 Difficulty in walking, not elsewhere classified: Secondary | ICD-10-CM | POA: Diagnosis not present

## 2020-05-29 DIAGNOSIS — R262 Difficulty in walking, not elsewhere classified: Secondary | ICD-10-CM | POA: Diagnosis not present

## 2020-05-29 DIAGNOSIS — M6281 Muscle weakness (generalized): Secondary | ICD-10-CM | POA: Diagnosis not present

## 2020-05-29 DIAGNOSIS — J449 Chronic obstructive pulmonary disease, unspecified: Secondary | ICD-10-CM | POA: Diagnosis not present

## 2020-06-01 DIAGNOSIS — R262 Difficulty in walking, not elsewhere classified: Secondary | ICD-10-CM | POA: Diagnosis not present

## 2020-06-01 DIAGNOSIS — M6281 Muscle weakness (generalized): Secondary | ICD-10-CM | POA: Diagnosis not present

## 2020-06-01 DIAGNOSIS — J449 Chronic obstructive pulmonary disease, unspecified: Secondary | ICD-10-CM | POA: Diagnosis not present

## 2020-06-02 DIAGNOSIS — M6281 Muscle weakness (generalized): Secondary | ICD-10-CM | POA: Diagnosis not present

## 2020-06-02 DIAGNOSIS — R262 Difficulty in walking, not elsewhere classified: Secondary | ICD-10-CM | POA: Diagnosis not present

## 2020-06-02 DIAGNOSIS — J449 Chronic obstructive pulmonary disease, unspecified: Secondary | ICD-10-CM | POA: Diagnosis not present

## 2020-06-03 DIAGNOSIS — J449 Chronic obstructive pulmonary disease, unspecified: Secondary | ICD-10-CM | POA: Diagnosis not present

## 2020-06-03 DIAGNOSIS — M6281 Muscle weakness (generalized): Secondary | ICD-10-CM | POA: Diagnosis not present

## 2020-06-03 DIAGNOSIS — R262 Difficulty in walking, not elsewhere classified: Secondary | ICD-10-CM | POA: Diagnosis not present

## 2020-06-04 DIAGNOSIS — H3561 Retinal hemorrhage, right eye: Secondary | ICD-10-CM | POA: Diagnosis not present

## 2020-06-04 DIAGNOSIS — H04123 Dry eye syndrome of bilateral lacrimal glands: Secondary | ICD-10-CM | POA: Diagnosis not present

## 2020-06-04 DIAGNOSIS — J449 Chronic obstructive pulmonary disease, unspecified: Secondary | ICD-10-CM | POA: Diagnosis not present

## 2020-06-04 DIAGNOSIS — H1045 Other chronic allergic conjunctivitis: Secondary | ICD-10-CM | POA: Diagnosis not present

## 2020-06-04 DIAGNOSIS — M6281 Muscle weakness (generalized): Secondary | ICD-10-CM | POA: Diagnosis not present

## 2020-06-04 DIAGNOSIS — H524 Presbyopia: Secondary | ICD-10-CM | POA: Diagnosis not present

## 2020-06-04 DIAGNOSIS — Z961 Presence of intraocular lens: Secondary | ICD-10-CM | POA: Diagnosis not present

## 2020-06-04 DIAGNOSIS — R262 Difficulty in walking, not elsewhere classified: Secondary | ICD-10-CM | POA: Diagnosis not present

## 2020-06-17 DIAGNOSIS — J449 Chronic obstructive pulmonary disease, unspecified: Secondary | ICD-10-CM | POA: Diagnosis not present

## 2020-06-17 DIAGNOSIS — S025XXA Fracture of tooth (traumatic), initial encounter for closed fracture: Secondary | ICD-10-CM | POA: Diagnosis not present

## 2020-06-17 DIAGNOSIS — I10 Essential (primary) hypertension: Secondary | ICD-10-CM | POA: Diagnosis not present

## 2020-06-17 DIAGNOSIS — K029 Dental caries, unspecified: Secondary | ICD-10-CM | POA: Diagnosis not present

## 2020-06-18 DIAGNOSIS — F319 Bipolar disorder, unspecified: Secondary | ICD-10-CM | POA: Diagnosis not present

## 2020-06-18 DIAGNOSIS — I10 Essential (primary) hypertension: Secondary | ICD-10-CM | POA: Diagnosis not present

## 2020-06-18 DIAGNOSIS — J449 Chronic obstructive pulmonary disease, unspecified: Secondary | ICD-10-CM | POA: Diagnosis not present

## 2020-06-18 DIAGNOSIS — F0390 Unspecified dementia without behavioral disturbance: Secondary | ICD-10-CM | POA: Diagnosis not present

## 2020-07-08 DIAGNOSIS — M5136 Other intervertebral disc degeneration, lumbar region: Secondary | ICD-10-CM | POA: Diagnosis not present

## 2020-07-17 DIAGNOSIS — Z5181 Encounter for therapeutic drug level monitoring: Secondary | ICD-10-CM | POA: Diagnosis not present

## 2020-07-29 ENCOUNTER — Ambulatory Visit (INDEPENDENT_AMBULATORY_CARE_PROVIDER_SITE_OTHER): Payer: Medicare Other | Admitting: Ophthalmology

## 2020-07-29 ENCOUNTER — Encounter (INDEPENDENT_AMBULATORY_CARE_PROVIDER_SITE_OTHER): Payer: Self-pay | Admitting: Ophthalmology

## 2020-07-29 ENCOUNTER — Other Ambulatory Visit: Payer: Self-pay

## 2020-07-29 DIAGNOSIS — H353212 Exudative age-related macular degeneration, right eye, with inactive choroidal neovascularization: Secondary | ICD-10-CM | POA: Insufficient documentation

## 2020-07-29 DIAGNOSIS — H353211 Exudative age-related macular degeneration, right eye, with active choroidal neovascularization: Secondary | ICD-10-CM | POA: Insufficient documentation

## 2020-07-29 DIAGNOSIS — H353122 Nonexudative age-related macular degeneration, left eye, intermediate dry stage: Secondary | ICD-10-CM | POA: Insufficient documentation

## 2020-07-29 MED ORDER — BEVACIZUMAB CHEMO INJECTION 1.25MG/0.05ML SYRINGE FOR KALEIDOSCOPE
1.2500 mg | INTRAVITREAL | Status: AC | PRN
  Administered 2020-07-29: 1.25 mg via INTRAVITREAL

## 2020-07-29 NOTE — Progress Notes (Signed)
07/29/2020     CHIEF COMPLAINT Patient presents for Retina Follow Up (2 YR FU OU, per Dr. Roger Shelter retinal hemorrhage OD, CAT SX OS 11/2017///Pt reports vision blurry when reading OU, pt reports "black spots" about a year ago that went away, and pain OD<OS///)   HISTORY OF PRESENT ILLNESS: Isabella Campbell is a 87 y.o. female who presents to the clinic today for:   HPI    Retina Follow Up    Patient presents with  Wet AMD.  In both eyes.  This started 2 years ago.  Severity is moderate.  Duration of 2 years.  Since onset it is gradually worsening. Additional comments: 2 YR FU OU, per Dr. Roger Shelter retinal hemorrhage OD, CAT SX OS 11/2017   Pt reports vision blurry when reading OU, pt reports "black spots" about a year ago that went away, and pain OD<OS          Last edited by Varney Biles D on 07/29/2020  9:12 AM. (History)      Referring physician: Ignatius Specking, MD [address] [place],  [place] [address]  HISTORICAL INFORMATION:   Selected notes from the MEDICAL RECORD NUMBER       CURRENT MEDICATIONS: No current outpatient medications on file. (Ophthalmic Drugs)   No current facility-administered medications for this visit. (Ophthalmic Drugs)   Current Outpatient Medications (Other)  Medication Sig  . acetaminophen (TYLENOL) 325 MG tablet Take 650 mg by mouth every 6 (six) hours as needed.  . Ascorbic Acid (VITAMIN C) 1000 MG tablet Take 1,000 mg by mouth daily.  Marland Kitchen atenolol (TENORMIN) 25 MG tablet Take 25 mg by mouth daily.   . benztropine (COGENTIN) 0.5 MG tablet Take 0.5 mg by mouth daily.   . Cholecalciferol 1000 units tablet Take 1,000 Units by mouth.  . Coenzyme Q10 100 MG capsule Take 100 mg by mouth 3 (three) times daily.  . divalproex (DEPAKOTE) 250 MG DR tablet Take 250 mg by mouth 2 (two) times daily.  Marland Kitchen donepezil (ARICEPT) 5 MG tablet Take 5 mg by mouth at bedtime.  . furosemide (LASIX) 20 MG tablet Take 1 tablet by mouth daily as needed for fluid.   Marland Kitchen  gabapentin (NEURONTIN) 100 MG capsule Take 100 mg by mouth at bedtime.  . Ipratropium-Albuterol (COMBIVENT) 20-100 MCG/ACT AERS respimat Inhale 1 puff into the lungs 4 (four) times daily.  . Multiple Vitamin (MULTIVITAMIN) tablet Take 1 tablet by mouth daily.  . Multiple Vitamins-Minerals (EYE VITAMINS & MINERALS) TABS Take 1 tablet by mouth daily.  . potassium chloride (K-DUR,KLOR-CON) 10 MEQ tablet Take 1 tablet by mouth daily as needed (for fluid retention when taking Lasix (Furosemide)).   Marland Kitchen risperiDONE (RISPERDAL) 1 MG tablet Take 1 mg by mouth at bedtime.   . traZODone (DESYREL) 50 MG tablet Take 50 mg by mouth at bedtime.  . Turmeric 500 MG CAPS Take 1 capsule by mouth daily.    No current facility-administered medications for this visit. (Other)      REVIEW OF SYSTEMS:    ALLERGIES Allergies  Allergen Reactions  . Doxycycline Hives  . Penicillins Hives  . Quetiapine     REACTION: involuntary licking of tongue against teeth, felt really bad on med  . Sulfonamide Derivatives Nausea And Vomiting    PAST MEDICAL HISTORY Past Medical History:  Diagnosis Date  . Bipolar 1 disorder (HCC)   . COPD (chronic obstructive pulmonary disease) (HCC)    CT chest 11/2005 Dr. Orson Aloe  .  Coronary artery disease    minimal bilateral disease  . Hypertension   . Pulmonary hypertension (HCC)    Past Surgical History:  Procedure Laterality Date  . CATARACT EXTRACTION Right   . CATARACT EXTRACTION W/PHACO Left 12/01/2017   Procedure: CATARACT EXTRACTION PHACO AND INTRAOCULAR LENS PLACEMENT (IOC);  Surgeon: Fabio PierceWrzosek, James, MD;  Location: [facility];  Service: Ophthalmology;  Laterality: Left;  CDE: 11.86  . DILATION AND CURETTAGE OF UTERUS    . hemrrhoidectomy      FAMILY HISTORY Family History  Problem Relation Age of Onset  . Cancer Other   . Coronary artery disease Other     SOCIAL HISTORY Social History   Tobacco Use  . Smoking status: Never Smoker  . Smokeless tobacco:  Never Used  Vaping Use  . Vaping Use: Never used  Substance Use Topics  . Alcohol use: No  . Drug use: Never         OPHTHALMIC EXAM: Base Eye Exam    Visual Acuity (ETDRS)      Right Left   Dist cc HM 20/50 +2   Dist ph cc  20/40 -2   Correction: Glasses       Tonometry (Tonopen, 9:20 AM)      Right Left   Pressure 15 17       Visual Fields (Counting fingers)      Left Right    Full    Restrictions  Partial outer inferior nasal deficiency       Extraocular Movement      Right Left    Full Full       Neuro/Psych    Oriented x3: Yes       Dilation    Both eyes: 1.0% Mydriacyl, 2.5% Phenylephrine @ 9:20 AM        Slit Lamp and Fundus Exam    External Exam      Right Left   External Normal Normal       Slit Lamp Exam      Right Left   Lids/Lashes Normal Normal   Conjunctiva/Sclera White and quiet White and quiet   Cornea Clear Clear   Anterior Chamber Deep and quiet Deep and quiet   Iris Round and reactive Round and reactive   Lens Centered posterior chamber intraocular lens Centered posterior chamber intraocular lens   Anterior Vitreous Normal Normal       Fundus Exam      Right Left   Posterior Vitreous Normal Normal   Disc Normal Pallor 1+   C/D Ratio 0.3 0.4   Macula Intraretinal hemorrhage, Macular thickening, Drusen, Intermediate age related macular degeneration Early age related macular degeneration   Vessels Normal Normal   Periphery Normal Normal          IMAGING AND PROCEDURES  Imaging and Procedures for 07/29/20  OCT, Retina - OU - Both Eyes       Right Eye Quality was good. Scan locations included subfoveal. Central Foveal Thickness: 429. Progression has worsened. Findings include intraretinal fluid, subretinal fluid, choroidal neovascular membrane.   Left Eye Quality was good. Scan locations included subfoveal. Progression has been stable. Findings include abnormal foveal contour, retinal drusen , no IRF, no SRF.    Notes New subretinal CNVM OD with intraretinal fluid, extensive with intraretinal hemorrhage and thickening       Intravitreal Injection, Pharmacologic Agent - OD - Right Eye       Time Out 07/29/2020. 10:27 AM. Confirmed correct patient, procedure,  site, and patient consented.   Anesthesia Topical anesthesia was used. Anesthetic medications included Akten 3.5%.   Procedure Preparation included Ofloxacin . A 30 gauge needle was used.   Injection:  1.25 mg Bevacizumab (AVASTIN) 1.25mg /0.48mL SOLN   NDC: 70360-001-02, Lot: [id number]   Route: Intravitreal, Site: Right Eye, Waste: 0 mg  Post-op Post injection exam found visual acuity of at least counting fingers. The patient tolerated the procedure well. There were no complications. The patient received written and verbal post procedure care education. Post injection medications were not given.                 ASSESSMENT/PLAN:  Intermediate stage nonexudative age-related macular degeneration of left eye The nature of age--related macular degeneration was discussed with the patient as well as the distinction between dry and wet types. Checking an Amsler Grid daily with advice to return immediately should a distortion develop, was given to the patient. The patient 's smoking status now and in the past was determined and advice based on the AREDS study was provided regarding the consumption of antioxidant supplements. AREDS 2 vitamin formulation was recommended. Consumption of dark leafy vegetables and fresh fruits of various colors was recommended. Treatment modalities for wet macular degeneration particularly the use of intravitreal injections of anti-blood vessel growth factors was discussed with the patient. Avastin, Lucentis, and Eylea are the available options. On occasion, therapy includes the use of photodynamic therapy and thermal laser. Stressed to the patient do not rub eyes.  Patient was advised to check Amsler Grid daily  and return immediately if changes are noted. Instructions on using the grid were given to the patient. All patient questions were answered.  Exudative age-related macular degeneration of right eye with active choroidal neovascularization (HCC) The nature of wet macular degeneration was discussed with the patient.  Forms of therapy reviewed include the use of Anti-VEGF medications injected painlessly into the eye, as well as other possible treatment modalities, including thermal laser therapy. Fellow eye involvement and risks were discussed with the patient. Upon the finding of wet age related macular degeneration, treatment will be offered. The treatment regimen is on a treat as needed basis with the intent to treat if necessary and extend interval of exams when possible. On average 1 out of 6 patients do not need lifetime therapy. However, the risk of recurrent disease is high for a lifetime.  Initially monthly, then periodic, examinations and evaluations will determine whether the next treatment is required on the day of the examination.      ICD-10-CM   1. Exudative age-related macular degeneration of right eye with active choroidal neovascularization (HCC)  H35.3211 OCT, Retina - OU - Both Eyes    Intravitreal Injection, Pharmacologic Agent - OD - Right Eye    Bevacizumab (AVASTIN) SOLN 1.25 mg  2. Intermediate stage nonexudative age-related macular degeneration of left eye  H35.3122 OCT, Retina - OU - Both Eyes    1.  2.  3.  Ophthalmic Meds Ordered this visit:  Meds ordered this encounter  Medications  . Bevacizumab (AVASTIN) SOLN 1.25 mg       Return in about 5 weeks (around 09/02/2020) for dilate, OD, AVASTIN OCT.  There are no Patient Instructions on file for this visit.   Explained the diagnoses, plan, and follow up with the patient and they expressed understanding.  Patient expressed understanding of the importance of proper follow up care.   Alford Highland Kailei Cowens M.D. Diseases  & Surgery of the Retina  and Vitreous [facility] 07/29/20     Abbreviations: M myopia (nearsighted); A astigmatism; H hyperopia (farsighted); P presbyopia; Mrx spectacle prescription;  CTL contact lenses; OD right eye; OS left eye; OU both eyes  XT exotropia; ET esotropia; PEK punctate epithelial keratitis; PEE punctate epithelial erosions; DES dry eye syndrome; MGD meibomian gland dysfunction; ATs artificial tears; PFAT's preservative free artificial tears; NSC nuclear sclerotic cataract; PSC posterior subcapsular cataract; ERM epi-retinal membrane; PVD posterior vitreous detachment; RD retinal detachment; DM diabetes mellitus; DR diabetic retinopathy; NPDR non-proliferative diabetic retinopathy; PDR proliferative diabetic retinopathy; CSME clinically significant macular edema; DME diabetic macular edema; dbh dot blot hemorrhages; CWS cotton wool spot; POAG primary open angle glaucoma; C/D cup-to-disc ratio; HVF humphrey visual field; GVF goldmann visual field; OCT optical coherence tomography; IOP intraocular pressure; BRVO Branch retinal vein occlusion; CRVO central retinal vein occlusion; CRAO central retinal artery occlusion; BRAO branch retinal artery occlusion; RT retinal tear; SB scleral buckle; PPV pars plana vitrectomy; VH Vitreous hemorrhage; PRP panretinal laser photocoagulation; IVK intravitreal kenalog; VMT vitreomacular traction; MH Macular hole;  NVD neovascularization of the disc; NVE neovascularization elsewhere; AREDS age related eye disease study; ARMD age related macular degeneration; POAG primary open angle glaucoma; EBMD epithelial/anterior basement membrane dystrophy; ACIOL anterior chamber intraocular lens; IOL intraocular lens; PCIOL posterior chamber intraocular lens; Phaco/IOL phacoemulsification with intraocular lens placement; PRK photorefractive keratectomy; LASIK laser assisted in situ keratomileusis; HTN hypertension; DM diabetes mellitus; COPD chronic  obstructive pulmonary disease

## 2020-07-29 NOTE — Assessment & Plan Note (Signed)

## 2020-07-29 NOTE — Assessment & Plan Note (Signed)

## 2020-08-12 DIAGNOSIS — B351 Tinea unguium: Secondary | ICD-10-CM | POA: Diagnosis not present

## 2020-08-12 DIAGNOSIS — I7091 Generalized atherosclerosis: Secondary | ICD-10-CM | POA: Diagnosis not present

## 2020-08-19 DIAGNOSIS — J449 Chronic obstructive pulmonary disease, unspecified: Secondary | ICD-10-CM | POA: Diagnosis not present

## 2020-08-19 DIAGNOSIS — F319 Bipolar disorder, unspecified: Secondary | ICD-10-CM | POA: Diagnosis not present

## 2020-08-19 DIAGNOSIS — I10 Essential (primary) hypertension: Secondary | ICD-10-CM | POA: Diagnosis not present

## 2020-08-19 DIAGNOSIS — F0390 Unspecified dementia without behavioral disturbance: Secondary | ICD-10-CM | POA: Diagnosis not present

## 2020-08-31 ENCOUNTER — Ambulatory Visit (INDEPENDENT_AMBULATORY_CARE_PROVIDER_SITE_OTHER): Payer: Medicare Other | Admitting: Ophthalmology

## 2020-08-31 ENCOUNTER — Other Ambulatory Visit: Payer: Self-pay

## 2020-08-31 ENCOUNTER — Encounter (INDEPENDENT_AMBULATORY_CARE_PROVIDER_SITE_OTHER): Payer: Self-pay | Admitting: Ophthalmology

## 2020-08-31 DIAGNOSIS — H353211 Exudative age-related macular degeneration, right eye, with active choroidal neovascularization: Secondary | ICD-10-CM

## 2020-08-31 DIAGNOSIS — H353122 Nonexudative age-related macular degeneration, left eye, intermediate dry stage: Secondary | ICD-10-CM

## 2020-08-31 MED ORDER — BEVACIZUMAB 2.5 MG/0.1ML IZ SOSY
2.5000 mg | PREFILLED_SYRINGE | INTRAVITREAL | Status: AC | PRN
  Administered 2020-08-31: 2.5 mg via INTRAVITREAL

## 2020-08-31 NOTE — Assessment & Plan Note (Signed)
Right eye post Avastin injection OD at 5 weeks, much less subretinal fluid and intraretinal fluid with a goal of preserving current visual functioning and preventing scotoma enlargement right eye  Extend interval examination next to 7 weeks

## 2020-08-31 NOTE — Assessment & Plan Note (Signed)
Monitor closely left eye by OCT examination

## 2020-08-31 NOTE — Progress Notes (Signed)
08/31/2020     CHIEF COMPLAINT Patient presents for Retina Follow Up (4 Week F/U OD, poss Avastin OD//Pt c/o rubbing her eyes constantly OU. Pt sts she is using artificial tears BID OU. Pt denies changes to [place] OU.)   HISTORY OF PRESENT ILLNESS: Isabella Campbell is a 87 y.o. female who presents to the clinic today for:   HPI    Retina Follow Up    Patient presents with  Wet AMD.  In right eye.  This started 4 weeks ago.  Severity is mild.  Duration of 4 weeks.  Since onset it is stable. Additional comments: 4 Week F/U OD, poss Avastin OD  Pt c/o rubbing her eyes constantly OU. Pt sts she is using artificial tears BID OU. Pt denies changes to [place] OU.       Last edited by Ileana Roup, COA on 08/31/2020 10:41 AM. (History)      Referring physician: Sherol Dade, DO [address] [place],  [place] [address]  HISTORICAL INFORMATION:   Selected notes from the MEDICAL RECORD NUMBER       CURRENT MEDICATIONS: No current outpatient medications on file. (Ophthalmic Drugs)   No current facility-administered medications for this visit. (Ophthalmic Drugs)   Current Outpatient Medications (Other)  Medication Sig  . acetaminophen (TYLENOL) 325 MG tablet Take 650 mg by mouth every 6 (six) hours as needed.  . Ascorbic Acid (VITAMIN C) 1000 MG tablet Take 1,000 mg by mouth daily.  Marland Kitchen atenolol (TENORMIN) 25 MG tablet Take 25 mg by mouth daily.   . benztropine (COGENTIN) 0.5 MG tablet Take 0.5 mg by mouth daily.   . Cholecalciferol 1000 units tablet Take 1,000 Units by mouth.  . Coenzyme Q10 100 MG capsule Take 100 mg by mouth 3 (three) times daily.  . divalproex (DEPAKOTE) 250 MG DR tablet Take 250 mg by mouth 2 (two) times daily.  Marland Kitchen donepezil (ARICEPT) 5 MG tablet Take 5 mg by mouth at bedtime.  . furosemide (LASIX) 20 MG tablet Take 1 tablet by mouth daily as needed for fluid.   Marland Kitchen gabapentin (NEURONTIN) 100 MG capsule Take 100 mg by mouth at bedtime.  . Ipratropium-Albuterol  (COMBIVENT) 20-100 MCG/ACT AERS respimat Inhale 1 puff into the lungs 4 (four) times daily.  . Multiple Vitamin (MULTIVITAMIN) tablet Take 1 tablet by mouth daily.  . Multiple Vitamins-Minerals (EYE VITAMINS & MINERALS) TABS Take 1 tablet by mouth daily.  . potassium chloride (K-DUR,KLOR-CON) 10 MEQ tablet Take 1 tablet by mouth daily as needed (for fluid retention when taking Lasix (Furosemide)).   Marland Kitchen risperiDONE (RISPERDAL) 1 MG tablet Take 1 mg by mouth at bedtime.   . traZODone (DESYREL) 50 MG tablet Take 50 mg by mouth at bedtime.  . Turmeric 500 MG CAPS Take 1 capsule by mouth daily.    No current facility-administered medications for this visit. (Other)      REVIEW OF SYSTEMS:    ALLERGIES Allergies  Allergen Reactions  . Doxycycline Hives  . Penicillins Hives  . Quetiapine     REACTION: involuntary licking of tongue against teeth, felt really bad on med  . Sulfonamide Derivatives Nausea And Vomiting    PAST MEDICAL HISTORY Past Medical History:  Diagnosis Date  . Bipolar 1 disorder (HCC)   . COPD (chronic obstructive pulmonary disease) (HCC)    CT chest 11/2005 Dr. Orson Aloe  . Coronary artery disease    minimal bilateral disease  . Hypertension   . Pulmonary hypertension (HCC)  Past Surgical History:  Procedure Laterality Date  . CATARACT EXTRACTION Right   . CATARACT EXTRACTION W/PHACO Left 12/01/2017   Procedure: CATARACT EXTRACTION PHACO AND INTRAOCULAR LENS PLACEMENT (IOC);  Surgeon: Fabio Pierce, MD;  Location: [facility];  Service: Ophthalmology;  Laterality: Left;  CDE: 11.86  . DILATION AND CURETTAGE OF UTERUS    . hemrrhoidectomy      FAMILY HISTORY Family History  Problem Relation Age of Onset  . Cancer Other   . Coronary artery disease Other     SOCIAL HISTORY Social History   Tobacco Use  . Smoking status: Never Smoker  . Smokeless tobacco: Never Used  Vaping Use  . Vaping Use: Never used  Substance Use Topics  . Alcohol use: No  .  Drug use: Never         OPHTHALMIC EXAM: Base Eye Exam    Visual Acuity (ETDRS)      Right Left   Dist cc CF @ 1' 20/50   Dist ph cc NI    Correction: Glasses       Tonometry (Tonopen, 10:41 AM)      Right Left   Pressure 21 12       Pupils      Pupils Dark Light Shape React APD   Right PERRL 4 3 Round Brisk None   Left PERRL 4 3 Round Brisk None       Visual Fields (Counting fingers)      Left Right    Full    Restrictions  Partial outer inferior nasal deficiency       Extraocular Movement      Right Left    Full Full       Neuro/Psych    Oriented x3: Yes   Mood/Affect: Normal       Dilation    Right eye: 1.0% Mydriacyl, 2.5% Phenylephrine @ 10:45 AM        Slit Lamp and Fundus Exam    External Exam      Right Left   External Normal Normal       Slit Lamp Exam      Right Left   Lids/Lashes Normal Normal   Conjunctiva/Sclera White and quiet White and quiet   Cornea Clear Clear   Anterior Chamber Deep and quiet Deep and quiet   Iris Round and reactive Round and reactive   Lens Centered posterior chamber intraocular lens Centered posterior chamber intraocular lens   Anterior Vitreous Normal Normal       Fundus Exam      Right Left   Posterior Vitreous Normal    Disc Normal    C/D Ratio 0.4    Macula Intraretinal hemorrhage less, less Macular thickening, [place]us[place], Intermediate age related macular degeneration, Disciform scar    Vessels Normal    Periphery Normal           IMAGING AND PROCEDURES  Imaging and Procedures for 08/31/20  OCT, Retina - OU - Both Eyes       Right Eye Quality was good. Scan locations included subfoveal. Central Foveal Thickness: 283. Progression has improved. Findings include no IRF, no SRF, subretinal scarring, disciform scar.   Left Eye Quality was good. Scan locations included subfoveal. Central Foveal Thickness: 324. Progression has been stable. Findings include retinal drusen .   Notes Small serous  retinal detachment left eye in the subfoveal location no change over time with good acuity will monitor and observe closely.  Right eye post Avastin  injection OD at 5 weeks, much less subretinal fluid and intraretinal fluid with a goal of preserving current visual functioning and preventing scotoma enlargement right eye       Intravitreal Injection, Pharmacologic Agent - OD - Right Eye       Time Out 08/31/2020. 11:28 AM. Confirmed correct patient, procedure, site, and patient consented.   Anesthesia Topical anesthesia was used. Anesthetic medications included Akten 3.5%.   Procedure Preparation included Ofloxacin . A 30 gauge needle was used.   Injection:  2.5 mg Bevacizumab (AVASTIN) 2.5mg /0.85mL SOSY   NDC: 59163-846-65, Lot: [id number]   Route: Intravitreal, Site: Right Eye  Post-op Post injection exam found visual acuity of at least counting fingers. The patient tolerated the procedure well. There were no complications. The patient received written and verbal post procedure care education. Post injection medications were not given.                 ASSESSMENT/PLAN:  Exudative age-related macular degeneration of right eye with active choroidal neovascularization (HCC) Right eye post Avastin injection OD at 5 weeks, much less subretinal fluid and intraretinal fluid with a goal of preserving current visual functioning and preventing scotoma enlargement right eye  Extend interval examination next to 7 weeks  Intermediate stage nonexudative age-related macular degeneration of left eye Monitor closely left eye by OCT examination      ICD-10-CM   1. Exudative age-related macular degeneration of right eye with active choroidal neovascularization (HCC)  H35.3211 OCT, Retina - OU - Both Eyes    Intravitreal Injection, Pharmacologic Agent - OD - Right Eye    bevacizumab (AVASTIN) SOSY 2.5 mg  2. Intermediate stage nonexudative age-related macular degeneration of left eye   H35.3122     1.  Improve macular findings by clinical examination and anatomy OD post Avastin at 5 weeks. Right eye post Avastin injection OD at 5 weeks, much less subretinal fluid and intraretinal fluid with a goal of preserving current visual functioning and preventing scotoma enlargement right eye  Follow-up OD in 7-week    2.  Will dilate OU next monitor serous retinal detachment left eye as well.  3.  Ophthalmic Meds Ordered this visit:  Meds ordered this encounter  Medications  . bevacizumab (AVASTIN) SOSY 2.5 mg       Return in about 7 weeks (around 10/19/2020) for dilate, OD, AVASTIN OCT, DILATE OU.  There are no Patient Instructions on file for this visit.   Explained the diagnoses, plan, and follow up with the patient and they expressed understanding.  Patient expressed understanding of the importance of proper follow up care.   Alford Highland Lenix Kidd M.D. Diseases & Surgery of the Retina and Vitreous [facility] 08/31/20     Abbreviations: M myopia (nearsighted); A astigmatism; H hyperopia (farsighted); P presbyopia; Mrx spectacle prescription;  CTL contact lenses; OD right eye; OS left eye; OU both eyes  XT exotropia; ET esotropia; PEK punctate epithelial keratitis; PEE punctate epithelial erosions; DES dry eye syndrome; MGD meibomian gland dysfunction; ATs artificial tears; PFAT's preservative free artificial tears; NSC nuclear sclerotic cataract; PSC posterior subcapsular cataract; ERM epi-retinal membrane; PVD posterior vitreous detachment; RD retinal detachment; DM diabetes mellitus; DR diabetic retinopathy; NPDR non-proliferative diabetic retinopathy; PDR proliferative diabetic retinopathy; CSME clinically significant macular edema; DME diabetic macular edema; dbh dot blot hemorrhages; CWS cotton wool spot; POAG primary open angle glaucoma; C/D cup-to-disc ratio; HVF humphrey visual field; GVF goldmann visual field; OCT optical coherence tomography; IOP  intraocular pressure; BRVO Branch retinal vein occlusion; CRVO central retinal vein occlusion; CRAO central retinal artery occlusion; BRAO branch retinal artery occlusion; RT retinal tear; SB scleral buckle; PPV pars plana vitrectomy; VH Vitreous hemorrhage; PRP panretinal laser photocoagulation; IVK intravitreal kenalog; VMT vitreomacular traction; MH Macular hole;  NVD neovascularization of the disc; NVE neovascularization elsewhere; AREDS age related eye disease study; ARMD age related macular degeneration; POAG primary open angle glaucoma; EBMD epithelial/anterior basement membrane dystrophy; ACIOL anterior chamber intraocular lens; IOL intraocular lens; PCIOL posterior chamber intraocular lens; Phaco/IOL phacoemulsification with intraocular lens placement; [place] photorefractive keratectomy; LASIK laser assisted in situ keratomileusis; HTN hypertension; DM diabetes mellitus; COPD chronic obstructive pulmonary disease

## 2020-09-01 ENCOUNTER — Encounter (INDEPENDENT_AMBULATORY_CARE_PROVIDER_SITE_OTHER): Payer: Medicare Other | Admitting: Ophthalmology

## 2020-09-01 DIAGNOSIS — H3530 Unspecified macular degeneration: Secondary | ICD-10-CM | POA: Diagnosis not present

## 2020-09-08 DIAGNOSIS — H353121 Nonexudative age-related macular degeneration, left eye, early dry stage: Secondary | ICD-10-CM | POA: Diagnosis not present

## 2020-09-08 DIAGNOSIS — Z961 Presence of intraocular lens: Secondary | ICD-10-CM | POA: Diagnosis not present

## 2020-09-08 DIAGNOSIS — H353210 Exudative age-related macular degeneration, right eye, stage unspecified: Secondary | ICD-10-CM | POA: Diagnosis not present

## 2020-09-29 DIAGNOSIS — Z23 Encounter for immunization: Secondary | ICD-10-CM | POA: Diagnosis not present

## 2020-10-12 DIAGNOSIS — R39191 Need to immediately re-void: Secondary | ICD-10-CM | POA: Diagnosis not present

## 2020-10-13 DIAGNOSIS — I7091 Generalized atherosclerosis: Secondary | ICD-10-CM | POA: Diagnosis not present

## 2020-10-13 DIAGNOSIS — B351 Tinea unguium: Secondary | ICD-10-CM | POA: Diagnosis not present

## 2020-10-19 ENCOUNTER — Other Ambulatory Visit: Payer: Self-pay

## 2020-10-19 ENCOUNTER — Encounter (INDEPENDENT_AMBULATORY_CARE_PROVIDER_SITE_OTHER): Payer: Self-pay | Admitting: Ophthalmology

## 2020-10-19 ENCOUNTER — Ambulatory Visit (INDEPENDENT_AMBULATORY_CARE_PROVIDER_SITE_OTHER): Payer: Medicare Other | Admitting: Ophthalmology

## 2020-10-19 DIAGNOSIS — H353122 Nonexudative age-related macular degeneration, left eye, intermediate dry stage: Secondary | ICD-10-CM | POA: Diagnosis not present

## 2020-10-19 DIAGNOSIS — H353211 Exudative age-related macular degeneration, right eye, with active choroidal neovascularization: Secondary | ICD-10-CM | POA: Diagnosis not present

## 2020-10-19 DIAGNOSIS — H353221 Exudative age-related macular degeneration, left eye, with active choroidal neovascularization: Secondary | ICD-10-CM | POA: Diagnosis not present

## 2020-10-19 MED ORDER — BEVACIZUMAB 2.5 MG/0.1ML IZ SOSY
2.5000 mg | PREFILLED_SYRINGE | INTRAVITREAL | Status: AC | PRN
  Administered 2020-10-19: 2.5 mg via INTRAVITREAL

## 2020-10-19 NOTE — Assessment & Plan Note (Signed)
Improved CNVM at 7-week follow-up post Avastin will repeat injection today and extend interval examination to 10 weeks for an eye with a subfoveal scar

## 2020-10-19 NOTE — Assessment & Plan Note (Signed)
Return tomorrow intravitreal Avastin injection OS no dilation

## 2020-10-19 NOTE — Assessment & Plan Note (Signed)
Gretchen to wet AMD OS today

## 2020-10-19 NOTE — Progress Notes (Signed)
10/19/2020     CHIEF COMPLAINT Patient presents for Retina Follow Up (7 Week Wet AMD f\u. Possible Avastin OS. OCT/Pt c/o blurry vision. Pt states she has pain over OD)   HISTORY OF PRESENT ILLNESS: Isabella Campbell is a 87 y.o. female who presents to the clinic today for:   HPI    Retina Follow Up    Patient presents with  Wet AMD.  In right eye.  Severity is moderate.  Duration of 7 weeks.  Since onset it is stable.  I, the attending physician,  performed the HPI with the patient and updated documentation appropriately. Additional comments: 7 Week Wet AMD f\u. Possible Avastin OS. OCT Pt c/o blurry vision. Pt states she has pain over OD       Last edited by Elyse Jarvis on 10/19/2020 10:01 AM. (History)      Referring physician: Sherol Dade, DO [address] [place],  [place] [address]  HISTORICAL INFORMATION:   Selected notes from the MEDICAL RECORD NUMBER       CURRENT MEDICATIONS: No current outpatient medications on file. (Ophthalmic Drugs)   No current facility-administered medications for this visit. (Ophthalmic Drugs)   Current Outpatient Medications (Other)  Medication Sig  . acetaminophen (TYLENOL) 325 MG tablet Take 650 mg by mouth every 6 (six) hours as needed.  . Ascorbic Acid (VITAMIN C) 1000 MG tablet Take 1,000 mg by mouth daily.  Marland Kitchen atenolol (TENORMIN) 25 MG tablet Take 25 mg by mouth daily.   . benztropine (COGENTIN) 0.5 MG tablet Take 0.5 mg by mouth daily.   . Cholecalciferol 1000 units tablet Take 1,000 Units by mouth.  . Coenzyme Q10 100 MG capsule Take 100 mg by mouth 3 (three) times daily.  . divalproex (DEPAKOTE) 250 MG DR tablet Take 250 mg by mouth 2 (two) times daily.  Marland Kitchen donepezil (ARICEPT) 5 MG tablet Take 5 mg by mouth at bedtime.  . furosemide (LASIX) 20 MG tablet Take 1 tablet by mouth daily as needed for fluid.   Marland Kitchen gabapentin (NEURONTIN) 100 MG capsule Take 100 mg by mouth at bedtime.  . Ipratropium-Albuterol (COMBIVENT)  20-100 MCG/ACT AERS respimat Inhale 1 puff into the lungs 4 (four) times daily.  . Multiple Vitamin (MULTIVITAMIN) tablet Take 1 tablet by mouth daily.  . Multiple Vitamins-Minerals (EYE VITAMINS & MINERALS) TABS Take 1 tablet by mouth daily.  . potassium chloride (K-DUR,KLOR-CON) 10 MEQ tablet Take 1 tablet by mouth daily as needed (for fluid retention when taking Lasix (Furosemide)).   Marland Kitchen risperiDONE (RISPERDAL) 1 MG tablet Take 1 mg by mouth at bedtime.   . traZODone (DESYREL) 50 MG tablet Take 50 mg by mouth at bedtime.  . Turmeric 500 MG CAPS Take 1 capsule by mouth daily.    No current facility-administered medications for this visit. (Other)      REVIEW OF SYSTEMS:    ALLERGIES Allergies  Allergen Reactions  . Doxycycline Hives  . Penicillins Hives  . Quetiapine     REACTION: involuntary licking of tongue against teeth, felt really bad on med  . Sulfonamide Derivatives Nausea And Vomiting    PAST MEDICAL HISTORY Past Medical History:  Diagnosis Date  . Bipolar 1 disorder (HCC)   . COPD (chronic obstructive pulmonary disease) (HCC)    CT chest 11/2005 Dr. Orson Aloe  . Coronary artery disease    minimal bilateral disease  . Hypertension   . Pulmonary hypertension (HCC)    Past Surgical History:  Procedure Laterality  Date  . CATARACT EXTRACTION Right   . CATARACT EXTRACTION W/PHACO Left 12/01/2017   Procedure: CATARACT EXTRACTION PHACO AND INTRAOCULAR LENS PLACEMENT (IOC);  Surgeon: Fabio Pierce, MD;  Location: [facility];  Service: Ophthalmology;  Laterality: Left;  CDE: 11.86  . DILATION AND CURETTAGE OF UTERUS    . hemrrhoidectomy      FAMILY HISTORY Family History  Problem Relation Age of Onset  . Cancer Other   . Coronary artery disease Other     SOCIAL HISTORY Social History   Tobacco Use  . Smoking status: Never Smoker  . Smokeless tobacco: Never Used  Vaping Use  . Vaping Use: Never used  Substance Use Topics  . Alcohol use: No  . Drug use: Never          OPHTHALMIC EXAM:  Base Eye Exam    Visual Acuity (Snellen - Linear)      Right Left   Dist cc CF @ 3' 20/60   Dist ph cc NI 20/50   Correction: Glasses       Tonometry (Tonopen, 10:07 AM)      Right Left   Pressure 10 13       Pupils      Pupils Dark Light Shape React APD   Right PERRL 3 2 Round Slow None   Left PERRL 3 2 Round Slow None       Visual Fields (Counting fingers)      Left Right    Full    Restrictions  Partial outer superior temporal, inferior temporal, superior nasal, inferior nasal deficiencies       Neuro/Psych    Oriented x3: Yes   Mood/Affect: Normal       Dilation    Both eyes: 1.0% Mydriacyl, 2.5% Phenylephrine @ 10:07 AM        Slit Lamp and Fundus Exam    External Exam      Right Left   External Normal Normal       Slit Lamp Exam      Right Left   Lids/Lashes Normal Normal   Conjunctiva/Sclera White and quiet White and quiet   Cornea Clear Clear   Anterior Chamber Deep and quiet Deep and quiet   Iris Round and reactive Round and reactive   Lens Centered posterior chamber intraocular lens Centered posterior chamber intraocular lens   Anterior Vitreous Normal Normal       Fundus Exam      Right Left   Posterior Vitreous Normal Normal   Disc Normal Pallor 1+   C/D Ratio 0.4 0.4   Macula Intraretinal hemorrhage less, less Macular thickening, Drusen, Intermediate age related macular degeneration, Disciform scar, small Subretinal hemorrhage,  Early age related macular degeneration   Vessels Normal Normal   Periphery Normal Normal          IMAGING AND PROCEDURES  Imaging and Procedures for 10/19/20  OCT, Retina - OU - Both Eyes       Right Eye Quality was good. Scan locations included subfoveal. Central Foveal Thickness: 349. Findings include abnormal foveal contour, subretinal scarring, disciform scar, no IRF, no SRF.   Left Eye Quality was good. Scan locations included subfoveal. Central Foveal Thickness:  347. Progression has worsened. Findings include abnormal foveal contour, subretinal fluid.   Notes New subretinal fluid left eye, extensive suggestion of wet ARMD will need to commence with therapy in the left eye soon  OD much less active CNVM, post intravitreal Avastin right eye some 7  weeks previous will need injection today and follow-up in 9 to 10 weeks right eye       Intravitreal Injection, Pharmacologic Agent - OD - Right Eye       Time Out 10/19/2020. 11:45 AM. Confirmed correct patient, procedure, site, and patient consented.   Anesthesia Topical anesthesia was used. Anesthetic medications included Akten 3.5%.   Procedure Preparation included Ofloxacin . A 30 gauge needle was used.   Injection:  2.5 mg Bevacizumab (AVASTIN) 2.5mg /0.62mL SOSY   NDC: 97530-051-10, Lot: [id number]   Route: Intravitreal, Site: Right Eye  Post-op Post injection exam found visual acuity of at least counting fingers. The patient tolerated the procedure well. There were no complications. The patient received written and verbal post procedure care education. Post injection medications were not given.                 ASSESSMENT/PLAN:  Intermediate stage nonexudative age-related macular degeneration of left eye Gretchen to wet AMD OS today  Exudative age-related macular degeneration of right eye with active choroidal neovascularization (HCC) Improved CNVM at 7-week follow-up post Avastin will repeat injection today and extend interval examination to 10 weeks for an eye with a subfoveal scar  Exudative age-related macular degeneration of left eye with active choroidal neovascularization (HCC) Return tomorrow intravitreal Avastin injection OS no dilation      ICD-10-CM   1. Exudative age-related macular degeneration of right eye with active choroidal neovascularization (HCC)  H35.3211 OCT, Retina - OU - Both Eyes    Intravitreal Injection, Pharmacologic Agent - OD - Right Eye     bevacizumab (AVASTIN) SOSY 2.5 mg  2. Intermediate stage nonexudative age-related macular degeneration of left eye  H35.3122   3. Exudative age-related macular degeneration of left eye with active choroidal neovascularization (HCC)  H35.3221     1.  New onset subretinal fluid OS, suggest new onset CNVM will need to commence therapy left eye soon  2.  Foveal scarring OD, stabilized at 7 weeks post Avastin will need repeat injection today and examination right eye again in 10 weeks  3.  Ophthalmic Meds Ordered this visit:  Meds ordered this encounter  Medications  . bevacizumab (AVASTIN) SOSY 2.5 mg       Return in about 1 day (around 10/20/2020) for AVASTIN OCT, OS, NO DILATE.  There are no Patient Instructions on file for this visit.   Explained the diagnoses, plan, and follow up with the patient and they expressed understanding.  Patient expressed understanding of the importance of proper follow up care.   Alford Highland Alsie Younes M.D. Diseases & Surgery of the Retina and Vitreous [facility] 10/19/20     Abbreviations: M myopia (nearsighted); A astigmatism; H hyperopia (farsighted); P presbyopia; Mrx spectacle prescription;  CTL contact lenses; OD right eye; OS left eye; OU both eyes  XT exotropia; ET esotropia; PEK punctate epithelial keratitis; PEE punctate epithelial erosions; DES dry eye syndrome; MGD meibomian gland dysfunction; ATs artificial tears; PFAT's preservative free artificial tears; NSC nuclear sclerotic cataract; PSC posterior subcapsular cataract; ERM epi-retinal membrane; PVD posterior vitreous detachment; RD retinal detachment; DM diabetes mellitus; DR diabetic retinopathy; NPDR non-proliferative diabetic retinopathy; PDR proliferative diabetic retinopathy; CSME clinically significant macular edema; DME diabetic macular edema; dbh dot blot hemorrhages; CWS cotton wool spot; POAG primary open angle glaucoma; C/D cup-to-disc ratio; HVF humphrey visual field;  GVF goldmann visual field; OCT optical coherence tomography; IOP intraocular pressure; BRVO Branch retinal vein occlusion; CRVO central retinal vein occlusion;  CRAO central retinal artery occlusion; BRAO branch retinal artery occlusion; RT retinal tear; SB scleral buckle; PPV pars plana vitrectomy; VH Vitreous hemorrhage; PRP panretinal laser photocoagulation; IVK intravitreal kenalog; VMT vitreomacular traction; MH Macular hole;  NVD neovascularization of the disc; NVE neovascularization elsewhere; AREDS age related eye disease study; ARMD age related macular degeneration; POAG primary open angle glaucoma; EBMD epithelial/anterior basement membrane dystrophy; ACIOL anterior chamber intraocular lens; IOL intraocular lens; PCIOL posterior chamber intraocular lens; Phaco/IOL phacoemulsification with intraocular lens placement; [place] photorefractive keratectomy; LASIK laser assisted in situ keratomileusis; HTN hypertension; DM diabetes mellitus; COPD chronic obstructive pulmonary disease

## 2020-10-20 ENCOUNTER — Encounter (INDEPENDENT_AMBULATORY_CARE_PROVIDER_SITE_OTHER): Payer: Self-pay | Admitting: Ophthalmology

## 2020-10-20 ENCOUNTER — Ambulatory Visit (INDEPENDENT_AMBULATORY_CARE_PROVIDER_SITE_OTHER): Payer: Medicare Other | Admitting: Ophthalmology

## 2020-10-20 DIAGNOSIS — H353221 Exudative age-related macular degeneration, left eye, with active choroidal neovascularization: Secondary | ICD-10-CM | POA: Diagnosis not present

## 2020-10-20 DIAGNOSIS — H353211 Exudative age-related macular degeneration, right eye, with active choroidal neovascularization: Secondary | ICD-10-CM

## 2020-10-20 MED ORDER — BEVACIZUMAB 2.5 MG/0.1ML IZ SOSY
2.5000 mg | PREFILLED_SYRINGE | INTRAVITREAL | Status: AC | PRN
  Administered 2020-10-20: 2.5 mg via INTRAVITREAL

## 2020-10-20 NOTE — Assessment & Plan Note (Signed)
1 day post injection right eye for subfoveal scarring

## 2020-10-20 NOTE — Assessment & Plan Note (Signed)
We will commence therapy left eye today for new onset subretinal fluid from wet ARMD.  Follow-up in 5 weeks post injection Avastin today

## 2020-10-20 NOTE — Progress Notes (Signed)
10/20/2020     CHIEF COMPLAINT Patient presents for Retina Follow Up (1 Day Avastin OS, no dilate//Pt sts she had pain following injection yesterday OD, but the nurse at her facility placed cool compresses on the eye which made the pain go away. No changes to [place] [facility].)   HISTORY OF PRESENT ILLNESS: Isabella Campbell is a 87 y.o. female who presents to the clinic today for:   HPI    Retina Follow Up    Patient presents with  Wet AMD.  In left eye.  This started 1 day ago.  Severity is mild.  Duration of 1 day.  Since onset it is stable. Additional comments: 1 Day Avastin OS, no dilate  Pt sts she had pain following injection yesterday OD, but the nurse at her facility placed cool compresses on the eye which made the pain go away. No changes to [place] [facility]U.       Last edited by Ileana Roup, COA on 10/20/2020  8:22 AM. (History)      Referring physician: Sherol Dade, DO [address] [place],  [place] [address]  HISTORICAL INFORMATION:   Selected notes from the MEDICAL RECORD NUMBER       CURRENT MEDICATIONS: No current outpatient medications on file. (Ophthalmic Drugs)   No current facility-administered medications for this visit. (Ophthalmic Drugs)   Current Outpatient Medications (Other)  Medication Sig  . acetaminophen (TYLENOL) 325 MG tablet Take 650 mg by mouth every 6 (six) hours as needed.  . Ascorbic Acid (VITAMIN C) 1000 MG tablet Take 1,000 mg by mouth daily.  Marland Kitchen atenolol (TENORMIN) 25 MG tablet Take 25 mg by mouth daily.   . benztropine (COGENTIN) 0.5 MG tablet Take 0.5 mg by mouth daily.   . Cholecalciferol 1000 units tablet Take 1,000 Units by mouth.  . Coenzyme Q10 100 MG capsule Take 100 mg by mouth 3 (three) times daily.  . divalproex (DEPAKOTE) 250 MG DR tablet Take 250 mg by mouth 2 (two) times daily.  Marland Kitchen donepezil (ARICEPT) 5 MG tablet Take 5 mg by mouth at bedtime.  . furosemide (LASIX) 20 MG tablet Take 1 tablet by mouth daily as needed for fluid.   Marland Kitchen  gabapentin (NEURONTIN) 100 MG capsule Take 100 mg by mouth at bedtime.  . Ipratropium-Albuterol (COMBIVENT) 20-100 MCG/ACT AERS respimat Inhale 1 puff into the lungs 4 (four) times daily.  . Multiple Vitamin (MULTIVITAMIN) tablet Take 1 tablet by mouth daily.  . Multiple Vitamins-Minerals (EYE VITAMINS & MINERALS) TABS Take 1 tablet by mouth daily.  . potassium chloride (K-DUR,KLOR-CON) 10 MEQ tablet Take 1 tablet by mouth daily as needed (for fluid retention when taking Lasix (Furosemide)).   Marland Kitchen risperiDONE (RISPERDAL) 1 MG tablet Take 1 mg by mouth at bedtime.   . traZODone (DESYREL) 50 MG tablet Take 50 mg by mouth at bedtime.  . Turmeric 500 MG CAPS Take 1 capsule by mouth daily.    No current facility-administered medications for this visit. (Other)      REVIEW OF SYSTEMS:    ALLERGIES Allergies  Allergen Reactions  . Doxycycline Hives  . Penicillins Hives  . Quetiapine     REACTION: involuntary licking of tongue against teeth, felt really bad on med  . Sulfonamide Derivatives Nausea And Vomiting    PAST MEDICAL HISTORY Past Medical History:  Diagnosis Date  . Bipolar 1 disorder (HCC)   . COPD (chronic obstructive pulmonary disease) (HCC)    CT chest 11/2005 Dr. Orson Aloe  .  Coronary artery disease    minimal bilateral disease  . Hypertension   . Pulmonary hypertension (HCC)    Past Surgical History:  Procedure Laterality Date  . CATARACT EXTRACTION Right   . CATARACT EXTRACTION W/PHACO Left 12/01/2017   Procedure: CATARACT EXTRACTION PHACO AND INTRAOCULAR LENS PLACEMENT (IOC);  Surgeon: Fabio Pierce, MD;  Location: [facility];  Service: Ophthalmology;  Laterality: Left;  CDE: 11.86  . DILATION AND CURETTAGE OF UTERUS    . hemrrhoidectomy      FAMILY HISTORY Family History  Problem Relation Age of Onset  . Cancer Other   . Coronary artery disease Other     SOCIAL HISTORY Social History   Tobacco Use  . Smoking status: Never Smoker  . Smokeless tobacco:  Never Used  Vaping Use  . Vaping Use: Never used  Substance Use Topics  . Alcohol use: No  . Drug use: Never         OPHTHALMIC EXAM:  Base Eye Exam    Visual Acuity (ETDRS)      Right Left   Dist cc CF at 3' 20/70 -2   Dist ph cc NI 20/50   Correction: Glasses       Tonometry (Tonopen, 8:22 AM)      Right Left   Pressure 13 09       Pupils      Pupils Dark Light Shape React APD   Right PERRL 3 2 Round Slow None   Left PERRL 3 2 Round Slow None       Visual Fields (Counting fingers)      Left Right    Full    Restrictions  Partial outer superior temporal, inferior temporal, superior nasal, inferior nasal deficiencies       Extraocular Movement      Right Left    Full Full       Neuro/Psych    Oriented x3: Yes   Mood/Affect: Normal       Dilation    Left eye:   Defer per GAR        Slit Lamp and Fundus Exam    External Exam      Right Left   External Normal Normal       Slit Lamp Exam      Right Left   Lids/Lashes Normal Normal   Conjunctiva/Sclera White and quiet White and quiet   Cornea Clear Clear   Anterior Chamber Deep and quiet Deep and quiet   Iris Round and reactive Round and reactive   Lens Centered posterior chamber intraocular lens Centered posterior chamber intraocular lens   Anterior Vitreous Normal Normal          IMAGING AND PROCEDURES  Imaging and Procedures for 10/20/20  Intravitreal Injection, Pharmacologic Agent - OS - Left Eye       Time Out 10/20/2020. 8:58 AM. Confirmed correct patient, procedure, site, and patient consented.   Anesthesia Topical anesthesia was used. Anesthetic medications included Akten 3.5%.   Procedure Preparation included Tobramycin 0.3%, 10% betadine to eyelids, 5% betadine to ocular surface. A 30 gauge needle was used.   Injection:  2.5 mg Bevacizumab (AVASTIN) 2.5mg /0.74mL SOSY   NDC: 21308-657-84, Lot: [id number]   Route: Intravitreal, Site: Left Eye  Post-op Post injection exam  found visual acuity of at least counting fingers. The patient tolerated the procedure well. There were no complications. The patient received written and verbal post procedure care education. Post injection medications were not given.  ASSESSMENT/PLAN:  Exudative age-related macular degeneration of right eye with active choroidal neovascularization (HCC) 1 day post injection right eye for subfoveal scarring  Exudative age-related macular degeneration of left eye with active choroidal neovascularization (HCC) We will commence therapy left eye today for new onset subretinal fluid from wet ARMD.  Follow-up in 5 weeks post injection Avastin today      ICD-10-CM   1. Exudative age-related macular degeneration of left eye with active choroidal neovascularization (HCC)  H35.3221 Intravitreal Injection, Pharmacologic Agent - OS - Left Eye    bevacizumab (AVASTIN) SOSY 2.5 mg    CANCELED: OCT, Retina - OU - Both Eyes  2. Exudative age-related macular degeneration of right eye with active choroidal neovascularization (HCC)  H35.3211     1.We will commence therapy left eye today for new onset subretinal fluid from wet ARMD.  Follow-up in 5 weeks post injection Avastin today  2.  3.  Ophthalmic Meds Ordered this visit:  Meds ordered this encounter  Medications  . bevacizumab (AVASTIN) SOSY 2.5 mg       Return in about 5 weeks (around 11/24/2020) for dilate, OS, AVASTIN OCT.  There are no Patient Instructions on file for this visit.   Explained the diagnoses, plan, and follow up with the patient and they expressed understanding.  Patient expressed understanding of the importance of proper follow up care.   Alford Highland Azoria Abbett M.D. Diseases & Surgery of the Retina and Vitreous [facility] 10/20/20     Abbreviations: M myopia (nearsighted); A astigmatism; H hyperopia (farsighted); P presbyopia; Mrx spectacle prescription;  CTL contact lenses; OD  right eye; OS left eye; OU both eyes  XT exotropia; ET esotropia; PEK punctate epithelial keratitis; PEE punctate epithelial erosions; DES dry eye syndrome; MGD meibomian gland dysfunction; ATs artificial tears; PFAT's preservative free artificial tears; NSC nuclear sclerotic cataract; PSC posterior subcapsular cataract; ERM epi-retinal membrane; PVD posterior vitreous detachment; RD retinal detachment; DM diabetes mellitus; DR diabetic retinopathy; NPDR non-proliferative diabetic retinopathy; PDR proliferative diabetic retinopathy; CSME clinically significant macular edema; DME diabetic macular edema; dbh dot blot hemorrhages; CWS cotton wool spot; POAG primary open angle glaucoma; C/D cup-to-disc ratio; HVF humphrey visual field; GVF goldmann visual field; OCT optical coherence tomography; IOP intraocular pressure; BRVO Branch retinal vein occlusion; CRVO central retinal vein occlusion; CRAO central retinal artery occlusion; BRAO branch retinal artery occlusion; RT retinal tear; SB scleral buckle; PPV pars plana vitrectomy; VH Vitreous hemorrhage; PRP panretinal laser photocoagulation; IVK intravitreal kenalog; VMT vitreomacular traction; MH Macular hole;  NVD neovascularization of the disc; NVE neovascularization elsewhere; AREDS age related eye disease study; ARMD age related macular degeneration; POAG primary open angle glaucoma; EBMD epithelial/anterior basement membrane dystrophy; ACIOL anterior chamber intraocular lens; IOL intraocular lens; PCIOL posterior chamber intraocular lens; Phaco/IOL phacoemulsification with intraocular lens placement; PRK photorefractive keratectomy; LASIK laser assisted in situ keratomileusis; HTN hypertension; DM diabetes mellitus; COPD chronic obstructive pulmonary disease

## 2020-11-01 DIAGNOSIS — R262 Difficulty in walking, not elsewhere classified: Secondary | ICD-10-CM | POA: Diagnosis not present

## 2020-11-01 DIAGNOSIS — R0981 Nasal congestion: Secondary | ICD-10-CM | POA: Diagnosis not present

## 2020-11-01 DIAGNOSIS — I517 Cardiomegaly: Secondary | ICD-10-CM | POA: Diagnosis not present

## 2020-11-01 DIAGNOSIS — R918 Other nonspecific abnormal finding of lung field: Secondary | ICD-10-CM | POA: Diagnosis not present

## 2020-11-01 DIAGNOSIS — M6281 Muscle weakness (generalized): Secondary | ICD-10-CM | POA: Diagnosis not present

## 2020-11-01 DIAGNOSIS — J449 Chronic obstructive pulmonary disease, unspecified: Secondary | ICD-10-CM | POA: Diagnosis not present

## 2020-11-04 DIAGNOSIS — I10 Essential (primary) hypertension: Secondary | ICD-10-CM | POA: Diagnosis not present

## 2020-11-04 DIAGNOSIS — M6281 Muscle weakness (generalized): Secondary | ICD-10-CM | POA: Diagnosis not present

## 2020-11-04 DIAGNOSIS — J449 Chronic obstructive pulmonary disease, unspecified: Secondary | ICD-10-CM | POA: Diagnosis not present

## 2020-11-04 DIAGNOSIS — R262 Difficulty in walking, not elsewhere classified: Secondary | ICD-10-CM | POA: Diagnosis not present

## 2020-11-05 DIAGNOSIS — I10 Essential (primary) hypertension: Secondary | ICD-10-CM | POA: Diagnosis not present

## 2020-11-05 DIAGNOSIS — J449 Chronic obstructive pulmonary disease, unspecified: Secondary | ICD-10-CM | POA: Diagnosis not present

## 2020-11-05 DIAGNOSIS — H3530 Unspecified macular degeneration: Secondary | ICD-10-CM | POA: Diagnosis not present

## 2020-11-05 DIAGNOSIS — F319 Bipolar disorder, unspecified: Secondary | ICD-10-CM | POA: Diagnosis not present

## 2020-11-05 DIAGNOSIS — F0390 Unspecified dementia without behavioral disturbance: Secondary | ICD-10-CM | POA: Diagnosis not present

## 2020-11-13 DIAGNOSIS — Z5181 Encounter for therapeutic drug level monitoring: Secondary | ICD-10-CM | POA: Diagnosis not present

## 2020-11-24 ENCOUNTER — Other Ambulatory Visit: Payer: Self-pay

## 2020-11-24 ENCOUNTER — Encounter (INDEPENDENT_AMBULATORY_CARE_PROVIDER_SITE_OTHER): Payer: Self-pay | Admitting: Ophthalmology

## 2020-11-24 ENCOUNTER — Ambulatory Visit (INDEPENDENT_AMBULATORY_CARE_PROVIDER_SITE_OTHER): Payer: Medicare Other | Admitting: Ophthalmology

## 2020-11-24 DIAGNOSIS — H353211 Exudative age-related macular degeneration, right eye, with active choroidal neovascularization: Secondary | ICD-10-CM

## 2020-11-24 DIAGNOSIS — H353221 Exudative age-related macular degeneration, left eye, with active choroidal neovascularization: Secondary | ICD-10-CM | POA: Diagnosis not present

## 2020-11-24 MED ORDER — BEVACIZUMAB 2.5 MG/0.1ML IZ SOSY
2.5000 mg | PREFILLED_SYRINGE | INTRAVITREAL | Status: AC | PRN
  Administered 2020-11-24: 2.5 mg via INTRAVITREAL

## 2020-11-24 NOTE — Assessment & Plan Note (Signed)
Dilate OD next as scheduled

## 2020-11-24 NOTE — Progress Notes (Signed)
11/24/2020     CHIEF COMPLAINT Patient presents for Retina Follow Up (5 Wk F/U OS, poss Avastin OS//Pt c/o decreased near [place] [facility]. Pt sts [place] is blurry when trying to read OU. No other new symptoms reported OU.)   HISTORY OF PRESENT ILLNESS: Isabella Campbell is a 87 y.o. female who presents to the clinic today for:   HPI    Retina Follow Up    Diagnosis: Wet AMD   Laterality: left eye   Onset: 5 weeks ago   Severity: moderate   Duration: 5 weeks   Course: stable   Comments: 5 Wk F/U OS, poss Avastin OS  Pt c/o decreased near [place]. Pt sts [place] is blurry when trying to read OU. No other new symptoms reported OU.       Last edited by Ileana Roup, COA on 11/24/2020  1:30 PM. (History)      Referring physician: Sherol Dade, DO [address] [place],  [place] [address]  HISTORICAL INFORMATION:   Selected notes from the MEDICAL RECORD NUMBER       CURRENT MEDICATIONS: No current outpatient medications on file. (Ophthalmic Drugs)   No current facility-administered medications for this visit. (Ophthalmic Drugs)   Current Outpatient Medications (Other)  Medication Sig  . acetaminophen (TYLENOL) 325 MG tablet Take 650 mg by mouth every 6 (six) hours as needed.  . Ascorbic Acid (VITAMIN C) 1000 MG tablet Take 1,000 mg by mouth daily.  Marland Kitchen atenolol (TENORMIN) 25 MG tablet Take 25 mg by mouth daily.   . benztropine (COGENTIN) 0.5 MG tablet Take 0.5 mg by mouth daily.   . Cholecalciferol 1000 units tablet Take 1,000 Units by mouth.  . Coenzyme Q10 100 MG capsule Take 100 mg by mouth 3 (three) times daily.  . divalproex (DEPAKOTE) 250 MG DR tablet Take 250 mg by mouth 2 (two) times daily.  Marland Kitchen donepezil (ARICEPT) 5 MG tablet Take 5 mg by mouth at bedtime.  . furosemide (LASIX) 20 MG tablet Take 1 tablet by mouth daily as needed for fluid.   Marland Kitchen gabapentin (NEURONTIN) 100 MG capsule Take 100 mg by mouth at bedtime.  . Ipratropium-Albuterol (COMBIVENT) 20-100 MCG/ACT AERS  respimat Inhale 1 puff into the lungs 4 (four) times daily.  . Multiple Vitamin (MULTIVITAMIN) tablet Take 1 tablet by mouth daily.  . Multiple Vitamins-Minerals (EYE VITAMINS & MINERALS) TABS Take 1 tablet by mouth daily.  . potassium chloride (K-DUR,KLOR-CON) 10 MEQ tablet Take 1 tablet by mouth daily as needed (for fluid retention when taking Lasix (Furosemide)).   Marland Kitchen risperiDONE (RISPERDAL) 1 MG tablet Take 1 mg by mouth at bedtime.   . traZODone (DESYREL) 50 MG tablet Take 50 mg by mouth at bedtime.  . Turmeric 500 MG CAPS Take 1 capsule by mouth daily.    No current facility-administered medications for this visit. (Other)      REVIEW OF SYSTEMS:    ALLERGIES Allergies  Allergen Reactions  . Doxycycline Hives  . Penicillins Hives  . Quetiapine     REACTION: involuntary licking of tongue against teeth, felt really bad on med  . Sulfonamide Derivatives Nausea And Vomiting    PAST MEDICAL HISTORY Past Medical History:  Diagnosis Date  . Bipolar 1 disorder (HCC)   . COPD (chronic obstructive pulmonary disease) (HCC)    CT chest 11/2005 Dr. Orson Aloe  . Coronary artery disease    minimal bilateral disease  . Hypertension   . Pulmonary hypertension (HCC)  Past Surgical History:  Procedure Laterality Date  . CATARACT EXTRACTION Right   . CATARACT EXTRACTION W/PHACO Left 12/01/2017   Procedure: CATARACT EXTRACTION PHACO AND INTRAOCULAR LENS PLACEMENT (IOC);  Surgeon: Fabio PierceWrzosek, James, MD;  Location: [facility];  Service: Ophthalmology;  Laterality: Left;  CDE: 11.86  . DILATION AND CURETTAGE OF UTERUS    . hemrrhoidectomy      FAMILY HISTORY Family History  Problem Relation Age of Onset  . Cancer Other   . Coronary artery disease Other     SOCIAL HISTORY Social History   Tobacco Use  . Smoking status: Never Smoker  . Smokeless tobacco: Never Used  Vaping Use  . Vaping Use: Never used  Substance Use Topics  . Alcohol use: No  . Drug use: Never          OPHTHALMIC EXAM: Base Eye Exam    Visual Acuity (ETDRS)      Right Left   Dist cc CF at 3' 20/60   Dist ph cc NI NI   Correction: Glasses       Tonometry (Tonopen, 1:35 PM)      Right Left   Pressure 19 13       Pupils      Pupils Dark Light Shape React APD   Right PERRL 4 4 Round Minimal None   Left PERRL 4 4 Round Minimal None       Visual Fields (Counting fingers)      Left Right    Full    Restrictions  Partial outer superior temporal, inferior temporal, superior nasal, inferior nasal deficiencies       Extraocular Movement      Right Left    Full Full       Neuro/Psych    Oriented x3: Yes   Mood/Affect: Normal       Dilation    Left eye: 1.0% Mydriacyl, 2.5% Phenylephrine @ 1:35 PM        Slit Lamp and Fundus Exam    External Exam      Right Left   External Normal Normal       Slit Lamp Exam      Right Left   Lids/Lashes Normal Normal   Conjunctiva/Sclera White and quiet White and quiet   Cornea Clear Clear   Anterior Chamber Deep and quiet Deep and quiet   Iris Round and reactive Round and reactive   Lens Centered posterior chamber intraocular lens Centered posterior chamber intraocular lens   Anterior Vitreous Normal Normal       Fundus Exam      Right Left   Posterior Vitreous  Normal   Disc  Pallor 1+   C/D Ratio  0.4   Macula  Early age related macular degeneration, , Disciform scar   Vessels  Normal   Periphery  Normal          IMAGING AND PROCEDURES  Imaging and Procedures for 11/24/20  OCT, Retina - OU - Both Eyes       Right Eye Quality was good. Scan locations included subfoveal. Central Foveal Thickness: 349. Findings include abnormal foveal contour, subretinal scarring, disciform scar, no IRF, no SRF.   Left Eye Quality was good. Scan locations included subfoveal. Central Foveal Thickness: 262. Progression has worsened. Findings include abnormal foveal contour, no IRF, no SRF, disciform scar.   Notes Much less  subretinal fluid left eye, 5 weeks post injection #1 of Avastin intravitreal, will need repeat injection today  OD  much less active CNVM, post intravitreal Avastin right eye some 5 weeks previous will need injection today and follow-up in total of 9 to 10 weeks right eye       Intravitreal Injection, Pharmacologic Agent - OS - Left Eye       Time Out 11/24/2020. 2:23 PM. Confirmed correct patient, procedure, site, and patient consented.   Anesthesia Topical anesthesia was used. Anesthetic medications included Akten 3.5%.   Procedure Preparation included Tobramycin 0.3%, 10% betadine to eyelids, 5% betadine to ocular surface. A 30 gauge needle was used.   Injection:  2.5 mg Bevacizumab (AVASTIN) 2.5mg /0.31mL SOSY   NDC: 25366-440-34, Lot: [id number]   Route: Intravitreal, Site: Left Eye  Post-op Post injection exam found visual acuity of at least counting fingers. The patient tolerated the procedure well. There were no complications. The patient received written and verbal post procedure care education. Post injection medications were not given.                 ASSESSMENT/PLAN:  Exudative age-related macular degeneration of left eye with active choroidal neovascularization (HCC) OS much improved post injection #1 intravitreal Avastin with now with less intraretinal and subretinal fluid.  We will repeat injection today and examination again next in 5 to 6 weeks  Exudative age-related macular degeneration of right eye with active choroidal neovascularization (HCC) Dilate OD next as scheduled      ICD-10-CM   1. Exudative age-related macular degeneration of left eye with active choroidal neovascularization (HCC)  H35.3221 OCT, Retina - OU - Both Eyes    Intravitreal Injection, Pharmacologic Agent - OS - Left Eye    bevacizumab (AVASTIN) SOSY 2.5 mg  2. Exudative age-related macular degeneration of right eye with active choroidal neovascularization (HCC)  H35.3211     1.  OS  vastly improved macular findings and stabilization of acuity one 5-week.  After injection #1 of Avastin intravitreal.  Much less subretinal fluid.  Will repeat injection today and examination OS again in 5 weeks  2.  OD currently at 5-week follow-up as well scheduled for follow-up roughly in 10 weeks total, approximately 5 weeks from now  3.  Ophthalmic Meds Ordered this visit:  Meds ordered this encounter  Medications  . bevacizumab (AVASTIN) SOSY 2.5 mg       Return in about 5 weeks (around 12/29/2020) for dilate, OS, AVASTIN OCT,,,, and dilate OD next as scheduled likely injection OD.  There are no Patient Instructions on file for this visit.   Explained the diagnoses, plan, and follow up with the patient and they expressed understanding.  Patient expressed understanding of the importance of proper follow up care.   Alford Highland Simi Briel M.D. Diseases & Surgery of the Retina and Vitreous [facility] 11/24/20     Abbreviations: M myopia (nearsighted); A astigmatism; H hyperopia (farsighted); P presbyopia; Mrx spectacle prescription;  CTL contact lenses; OD right eye; OS left eye; OU both eyes  XT exotropia; ET esotropia; PEK punctate epithelial keratitis; PEE punctate epithelial erosions; DES dry eye syndrome; MGD meibomian gland dysfunction; ATs artificial tears; PFAT's preservative free artificial tears; NSC nuclear sclerotic cataract; PSC posterior subcapsular cataract; ERM epi-retinal membrane; PVD posterior vitreous detachment; RD retinal detachment; DM diabetes mellitus; DR diabetic retinopathy; NPDR non-proliferative diabetic retinopathy; PDR proliferative diabetic retinopathy; CSME clinically significant macular edema; DME diabetic macular edema; dbh dot blot hemorrhages; CWS cotton wool spot; POAG primary open angle glaucoma; C/D cup-to-disc ratio; HVF humphrey visual field; GVF goldmann visual  field; OCT optical coherence tomography; IOP intraocular pressure; BRVO  Branch retinal vein occlusion; CRVO central retinal vein occlusion; CRAO central retinal artery occlusion; BRAO branch retinal artery occlusion; RT retinal tear; SB scleral buckle; PPV pars plana vitrectomy; VH Vitreous hemorrhage; PRP panretinal laser photocoagulation; IVK intravitreal kenalog; VMT vitreomacular traction; MH Macular hole;  NVD neovascularization of the disc; NVE neovascularization elsewhere; AREDS age related eye disease study; ARMD age related macular degeneration; POAG primary open angle glaucoma; EBMD epithelial/anterior basement membrane dystrophy; ACIOL anterior chamber intraocular lens; IOL intraocular lens; PCIOL posterior chamber intraocular lens; Phaco/IOL phacoemulsification with intraocular lens placement; PRK photorefractive keratectomy; LASIK laser assisted in situ keratomileusis; HTN hypertension; DM diabetes mellitus; COPD chronic obstructive pulmonary disease

## 2020-11-24 NOTE — Assessment & Plan Note (Signed)
OS much improved post injection #1 intravitreal Avastin with now with less intraretinal and subretinal fluid.  We will repeat injection today and examination again next in 5 to 6 weeks

## 2020-12-15 DIAGNOSIS — B351 Tinea unguium: Secondary | ICD-10-CM | POA: Diagnosis not present

## 2020-12-15 DIAGNOSIS — I7091 Generalized atherosclerosis: Secondary | ICD-10-CM | POA: Diagnosis not present

## 2020-12-29 ENCOUNTER — Encounter (INDEPENDENT_AMBULATORY_CARE_PROVIDER_SITE_OTHER): Payer: Self-pay | Admitting: Ophthalmology

## 2020-12-29 ENCOUNTER — Other Ambulatory Visit: Payer: Self-pay

## 2020-12-29 ENCOUNTER — Ambulatory Visit (INDEPENDENT_AMBULATORY_CARE_PROVIDER_SITE_OTHER): Payer: Medicare Other | Admitting: Ophthalmology

## 2020-12-29 DIAGNOSIS — H353211 Exudative age-related macular degeneration, right eye, with active choroidal neovascularization: Secondary | ICD-10-CM

## 2020-12-29 DIAGNOSIS — H353221 Exudative age-related macular degeneration, left eye, with active choroidal neovascularization: Secondary | ICD-10-CM | POA: Diagnosis not present

## 2020-12-29 MED ORDER — BEVACIZUMAB 2.5 MG/0.1ML IZ SOSY
2.5000 mg | PREFILLED_SYRINGE | INTRAVITREAL | Status: AC | PRN
  Administered 2020-12-29: 2.5 mg via INTRAVITREAL

## 2020-12-29 NOTE — Assessment & Plan Note (Signed)
Left eye OS vastly improved after recent injection commencement in April 2022 at 5-week interval.  We will repeat injection today at 5-week interval and examination OU in 10 weeks

## 2020-12-29 NOTE — Assessment & Plan Note (Signed)
Much improved retinal anatomy in the macula as a consequence of onset of therapy of Avastin commencing January 2022.  Nonetheless vision limited by diffuse atrophy.  Repeat injection right eye today at 10-week interval

## 2020-12-29 NOTE — Progress Notes (Addendum)
12/29/2020     CHIEF COMPLAINT Patient presents for Retina Evaluation (10 Wk F/U OD, 5 Wk F/U OS, poss Avastin OU//Pt c/o difficulty reading OU. Pt c/o blur only at near OU. No other new symptoms OU.)   HISTORY OF PRESENT ILLNESS: Isabella Campbell is a [age] y.o. female who presents to the clinic today for:   HPI     Retina Evaluation           Laterality: both eyes   Onset: 5 weeks ago   Duration: 5 weeks   Context: near vision and reading   Comments: 10 Wk F/U OD, 5 Wk F/U OS, poss Avastin OU  Pt c/o difficulty reading OU. Pt c/o blur only at near OU. No other new symptoms OU.       Last edited by Edmon Crapeankin, Necole Minassian A, MD on 12/29/2020 10:58 AM.      Referring physician: Sherol DadeSimpson-Tarokh, Leann, DO [address] [place],  [place] [id number]  HISTORICAL INFORMATION:   Selected notes from the MEDICAL RECORD NUMBER       CURRENT MEDICATIONS: No current outpatient medications on file. (Ophthalmic Drugs)   No current facility-administered medications for this visit. (Ophthalmic Drugs)   Current Outpatient Medications (Other)  Medication Sig   acetaminophen (TYLENOL) 325 MG tablet Take 650 mg by mouth every 6 (six) hours as needed.   Ascorbic Acid (VITAMIN C) 1000 MG tablet Take 1,000 mg by mouth daily.   atenolol (TENORMIN) 25 MG tablet Take 25 mg by mouth daily.    benztropine (COGENTIN) 0.5 MG tablet Take 0.5 mg by mouth daily.    Cholecalciferol 1000 units tablet Take 1,000 Units by mouth.   Coenzyme Q10 100 MG capsule Take 100 mg by mouth 3 (three) times daily.   divalproex (DEPAKOTE) 250 MG DR tablet Take 250 mg by mouth 2 (two) times daily.   donepezil (ARICEPT) 5 MG tablet Take 5 mg by mouth at bedtime.   furosemide (LASIX) 20 MG tablet Take 1 tablet by mouth daily as needed for fluid.    gabapentin (NEURONTIN) 100 MG capsule Take 100 mg by mouth at bedtime.   Ipratropium-Albuterol (COMBIVENT) 20-100 MCG/ACT AERS respimat Inhale 1 puff into the lungs 4 (four) times  daily.   Multiple Vitamin (MULTIVITAMIN) tablet Take 1 tablet by mouth daily.   Multiple Vitamins-Minerals (EYE VITAMINS & MINERALS) TABS Take 1 tablet by mouth daily.   potassium chloride (K-DUR,KLOR-CON) 10 MEQ tablet Take 1 tablet by mouth daily as needed (for fluid retention when taking Lasix (Furosemide)).    risperiDONE (RISPERDAL) 1 MG tablet Take 1 mg by mouth at bedtime.    traZODone (DESYREL) 50 MG tablet Take 50 mg by mouth at bedtime.   Turmeric 500 MG CAPS Take 1 capsule by mouth daily.    No current facility-administered medications for this visit. (Other)      REVIEW OF SYSTEMS:    ALLERGIES Allergies  Allergen Reactions   Doxycycline Hives   Penicillins Hives   Quetiapine     REACTION: involuntary licking of tongue against teeth, felt really bad on med   Sulfonamide Derivatives Nausea And Vomiting    PAST MEDICAL HISTORY Past Medical History:  Diagnosis Date   Bipolar 1 disorder (HCC)    COPD (chronic obstructive pulmonary disease) (HCC)    CT chest 11/2005 Dr. Orson AloeHenderson   Coronary artery disease    minimal bilateral disease   Hypertension    Pulmonary hypertension [facility](HCC)    Past Surgical  History:  Procedure Laterality Date   CATARACT EXTRACTION Right    CATARACT EXTRACTION W/PHACO Left 12/01/2017   Procedure: CATARACT EXTRACTION PHACO AND INTRAOCULAR LENS PLACEMENT (IOC);  Surgeon: Fabio Pierce, MD;  Location: [facility];  Service: Ophthalmology;  Laterality: Left;  CDE: 11.86   DILATION AND CURETTAGE OF UTERUS     hemrrhoidectomy      FAMILY HISTORY Family History  Problem Relation Age of Onset   Cancer Other    Coronary artery disease Other     SOCIAL HISTORY Social History   Tobacco Use   Smoking status: Never   Smokeless tobacco: Never  Vaping Use   Vaping Use: Never used  Substance Use Topics   Alcohol use: No   Drug use: Never         OPHTHALMIC EXAM:  Base Eye Exam     Visual Acuity (ETDRS)       Right Left   Dist cc CF  at 3' CF at 3'   Dist ph cc NI NI    Correction: Glasses  Difficult pt understanding        Tonometry (Tonopen, 11:02 AM)       Right Left   Pressure 21 14         Pupils       Pupils Dark Light Shape React APD   Right PERRL 4 4 Round Minimal None   Left PERRL 4 4 Round Minimal None         Visual Fields     Counting fingers         Extraocular Movement       Right Left    Full Full         Neuro/Psych     Oriented x3: Yes   Mood/Affect: Normal         Dilation     Both eyes: 1.0% Mydriacyl, 2.5% Phenylephrine @ 11:02 AM           Slit Lamp and Fundus Exam     External Exam       Right Left   External Normal Normal         Slit Lamp Exam       Right Left   Lids/Lashes Normal Normal   Conjunctiva/Sclera White and quiet White and quiet   Cornea Clear Clear   Anterior Chamber Deep and quiet Deep and quiet   Iris Round and reactive Round and reactive   Lens Centered posterior chamber intraocular lens Centered posterior chamber intraocular lens   Anterior Vitreous Normal Normal         Fundus Exam       Right Left   Posterior Vitreous Normal Normal   Disc Normal Pallor 1+   C/D Ratio 0.4 0.4   Macula  less Macular thickening, Drusen, Intermediate age related macular degeneration, Disciform scar, small Subretinal hemorrhage, , no hemorrhage Early age related macular degeneration, , Disciform scar   Vessels Normal Normal   Periphery Normal Normal            IMAGING AND PROCEDURES  Imaging and Procedures for 12/29/20  OCT, Retina - OU - Both Eyes       Right Eye Quality was good. Scan locations included subfoveal. Progression has improved. Findings include abnormal foveal contour.   Left Eye Quality was good. Scan locations included subfoveal. Progression has been stable. Findings include abnormal foveal contour, retinal drusen .   Notes OD vastly improved as compared to onset of  therapy January 2022.  We will  repeat injection today and follow-up OU in 10 weeks OU  OS also stable with diffuse atrophy limiting acuity.  We will repeat injection today and follow-up in 10 weeks     Intravitreal Injection, Pharmacologic Agent - OD - Right Eye       Time Out 12/29/2020. 11:29 AM. Confirmed correct patient, procedure, site, and patient consented.   Anesthesia Topical anesthesia was used. Anesthetic medications included Akten 3.5%.   Procedure Preparation included Ofloxacin . A 30 gauge needle was used.   Injection: 2.5 mg bevacizumab 2.5 MG/0.1ML   Route: Intravitreal, Site: Right Eye   NDC: [phone], Lot: [id number]   Post-op Post injection exam found visual acuity of at least counting fingers. The patient tolerated the procedure well. There were no complications. The patient received written and verbal post procedure care education. Post injection medications were not given.      Intravitreal Injection, Pharmacologic Agent - OS - Left Eye       Time Out 12/29/2020. 11:30 AM. Confirmed correct patient, procedure, site, and patient consented.   Anesthesia Topical anesthesia was used. Anesthetic medications included Akten 3.5%.   Procedure Preparation included Tobramycin 0.3%, 10% betadine to eyelids, 5% betadine to ocular surface. A 30 gauge needle was used.   Injection: 2.5 mg bevacizumab 2.5 MG/0.1ML   Route: Intravitreal, Site: Left Eye   NDC: [phone], Lot: [id number]   Post-op Post injection exam found visual acuity of at least counting fingers. The patient tolerated the procedure well. There were no complications. The patient received written and verbal post procedure care education. Post injection medications were not given.              ASSESSMENT/PLAN:  Exudative age-related macular degeneration of right eye with active choroidal neovascularization (HCC) Much improved retinal anatomy in the macula as a consequence of onset of therapy of Avastin commencing January  2022.  Nonetheless vision limited by diffuse atrophy.  Repeat injection right eye today at 10-week interval  Exudative age-related macular degeneration of left eye with active choroidal neovascularization (HCC) Left eye OS vastly improved after recent injection commencement in April 2022 at 5-week interval.  We will repeat injection today at 5-week interval and examination OU in 10 weeks      ICD-10-CM   1. Exudative age-related macular degeneration of left eye with active choroidal neovascularization (HCC)  H35.3221 OCT, Retina - OU - Both Eyes    Intravitreal Injection, Pharmacologic Agent - OS - Left Eye    bevacizumab (AVASTIN) SOSY 2.5 mg    2. Exudative age-related macular degeneration of right eye with active choroidal neovascularization (HCC)  H35.3211 OCT, Retina - OU - Both Eyes    Intravitreal Injection, Pharmacologic Agent - OD - Right Eye    bevacizumab (AVASTIN) SOSY 2.5 mg      1.  Visual acuity test today in a wheelchair and on several room with count fingers vision may not be accurate in the left eye as the patient still says she reads at home although somewhat blurry  2.  OU overall improved vastly on therapy.  Repeat injection Avastin OU today examination OU next in 10 weeks  3.  Ophthalmic Meds Ordered this visit:  Meds ordered this encounter  Medications   bevacizumab (AVASTIN) SOSY 2.5 mg   bevacizumab (AVASTIN) SOSY 2.5 mg       Return in about 10 weeks (around 03/09/2021) for DILATE OU, AVASTIN OCT, OD, OS.  There are no  Patient Instructions on file for this visit.   Explained the diagnoses, plan, and follow up with the patient and they expressed understanding.  Patient expressed understanding of the importance of proper follow up care.   Alford Highland Dazani Norby M.D. Diseases & Surgery of the Retina and Vitreous [facility] 12/29/20     Abbreviations: M myopia (nearsighted); A astigmatism; H hyperopia (farsighted); P presbyopia; Mrx  spectacle prescription;  CTL contact lenses; OD right eye; OS left eye; OU both eyes  XT exotropia; ET esotropia; PEK punctate epithelial keratitis; PEE punctate epithelial erosions; DES dry eye syndrome; MGD meibomian gland dysfunction; ATs artificial tears; PFAT's preservative free artificial tears; NSC nuclear sclerotic cataract; PSC posterior subcapsular cataract; ERM epi-retinal membrane; PVD posterior vitreous detachment; RD retinal detachment; DM diabetes mellitus; DR diabetic retinopathy; NPDR non-proliferative diabetic retinopathy; PDR proliferative diabetic retinopathy; CSME clinically significant macular edema; DME diabetic macular edema; dbh dot blot hemorrhages; CWS cotton wool spot; POAG primary open angle glaucoma; C/D cup-to-disc ratio; HVF humphrey visual field; GVF goldmann visual field; OCT optical coherence tomography; IOP intraocular pressure; BRVO Branch retinal vein occlusion; CRVO central retinal vein occlusion; CRAO central retinal artery occlusion; BRAO branch retinal artery occlusion; RT retinal tear; SB scleral buckle; PPV pars plana vitrectomy; VH Vitreous hemorrhage; PRP panretinal laser photocoagulation; IVK intravitreal kenalog; VMT vitreomacular traction; MH Macular hole;  NVD neovascularization of the disc; NVE neovascularization elsewhere; AREDS age related eye disease study; ARMD age related macular degeneration; POAG primary open angle glaucoma; EBMD epithelial/anterior basement membrane dystrophy; ACIOL anterior chamber intraocular lens; IOL intraocular lens; PCIOL posterior chamber intraocular lens; Phaco/IOL phacoemulsification with intraocular lens placement; PRK photorefractive keratectomy; LASIK laser assisted in situ keratomileusis; HTN hypertension; DM diabetes mellitus; COPD chronic obstructive pulmonary disease

## 2020-12-30 ENCOUNTER — Encounter (INDEPENDENT_AMBULATORY_CARE_PROVIDER_SITE_OTHER): Payer: Medicare Other | Admitting: Ophthalmology

## 2021-01-19 DIAGNOSIS — F0390 Unspecified dementia without behavioral disturbance: Secondary | ICD-10-CM | POA: Diagnosis not present

## 2021-01-19 DIAGNOSIS — F0391 Unspecified dementia with behavioral disturbance: Secondary | ICD-10-CM | POA: Diagnosis not present

## 2021-01-19 DIAGNOSIS — J449 Chronic obstructive pulmonary disease, unspecified: Secondary | ICD-10-CM | POA: Diagnosis not present

## 2021-01-19 DIAGNOSIS — F319 Bipolar disorder, unspecified: Secondary | ICD-10-CM | POA: Diagnosis not present

## 2021-01-26 DIAGNOSIS — R1012 Left upper quadrant pain: Secondary | ICD-10-CM | POA: Diagnosis not present

## 2021-01-26 DIAGNOSIS — R0789 Other chest pain: Secondary | ICD-10-CM | POA: Diagnosis not present

## 2021-02-05 DIAGNOSIS — H3530 Unspecified macular degeneration: Secondary | ICD-10-CM | POA: Diagnosis not present

## 2021-02-05 DIAGNOSIS — F0390 Unspecified dementia without behavioral disturbance: Secondary | ICD-10-CM | POA: Diagnosis not present

## 2021-02-05 DIAGNOSIS — J449 Chronic obstructive pulmonary disease, unspecified: Secondary | ICD-10-CM | POA: Diagnosis not present

## 2021-02-05 DIAGNOSIS — I10 Essential (primary) hypertension: Secondary | ICD-10-CM | POA: Diagnosis not present

## 2021-02-05 DIAGNOSIS — F319 Bipolar disorder, unspecified: Secondary | ICD-10-CM | POA: Diagnosis not present

## 2021-02-24 DIAGNOSIS — I7091 Generalized atherosclerosis: Secondary | ICD-10-CM | POA: Diagnosis not present

## 2021-02-24 DIAGNOSIS — L603 Nail dystrophy: Secondary | ICD-10-CM | POA: Diagnosis not present

## 2021-03-09 ENCOUNTER — Encounter (INDEPENDENT_AMBULATORY_CARE_PROVIDER_SITE_OTHER): Payer: Self-pay | Admitting: Ophthalmology

## 2021-03-09 ENCOUNTER — Other Ambulatory Visit: Payer: Self-pay

## 2021-03-09 ENCOUNTER — Ambulatory Visit (INDEPENDENT_AMBULATORY_CARE_PROVIDER_SITE_OTHER): Payer: Medicare Other | Admitting: Ophthalmology

## 2021-03-09 DIAGNOSIS — H353221 Exudative age-related macular degeneration, left eye, with active choroidal neovascularization: Secondary | ICD-10-CM | POA: Diagnosis not present

## 2021-03-09 DIAGNOSIS — H353211 Exudative age-related macular degeneration, right eye, with active choroidal neovascularization: Secondary | ICD-10-CM

## 2021-03-09 MED ORDER — BEVACIZUMAB 2.5 MG/0.1ML IZ SOSY
2.5000 mg | PREFILLED_SYRINGE | INTRAVITREAL | Status: AC | PRN
  Administered 2021-03-09: 2.5 mg via INTRAVITREAL

## 2021-03-09 NOTE — Progress Notes (Signed)
03/09/2021     CHIEF COMPLAINT Patient presents for  Chief Complaint  Patient presents with   Retina Follow Up      HISTORY OF PRESENT ILLNESS: Isabella Campbell is a 87 y.o. female who presents to the clinic today for:   HPI     Retina Follow Up           Diagnosis: Wet AMD   Laterality: both eyes   Onset: 10 weeks ago   Severity: mild   Duration: 10 weeks   Course: gradually worsening         Comments   10 week fu OU and Avastin OU Pt states, "I think that my vision has gotten a little worse. I have a very hard time reading."       Last edited by Demetrios Loll, COA on 03/09/2021 11:13 AM.      Referring physician: Sherol Dade, DO [address] [place],  [place] [address]  HISTORICAL INFORMATION:   Selected notes from the MEDICAL RECORD NUMBER       CURRENT MEDICATIONS: No current outpatient medications on file. (Ophthalmic Drugs)   No current facility-administered medications for this visit. (Ophthalmic Drugs)   Current Outpatient Medications (Other)  Medication Sig   acetaminophen (TYLENOL) 325 MG tablet Take 650 mg by mouth every 6 (six) hours as needed.   Ascorbic Acid (VITAMIN C) 1000 MG tablet Take 1,000 mg by mouth daily.   atenolol (TENORMIN) 25 MG tablet Take 25 mg by mouth daily.    benztropine (COGENTIN) 0.5 MG tablet Take 0.5 mg by mouth daily.    Cholecalciferol 1000 units tablet Take 1,000 Units by mouth.   Coenzyme Q10 100 MG capsule Take 100 mg by mouth 3 (three) times daily.   divalproex (DEPAKOTE) 250 MG DR tablet Take 250 mg by mouth 2 (two) times daily.   donepezil (ARICEPT) 5 MG tablet Take 5 mg by mouth at bedtime.   furosemide (LASIX) 20 MG tablet Take 1 tablet by mouth daily as needed for fluid.    gabapentin (NEURONTIN) 100 MG capsule Take 100 mg by mouth at bedtime.   Ipratropium-Albuterol (COMBIVENT) 20-100 MCG/ACT AERS respimat Inhale 1 puff into the lungs 4 (four) times daily.   Multiple Vitamin (MULTIVITAMIN)  tablet Take 1 tablet by mouth daily.   Multiple Vitamins-Minerals (EYE VITAMINS & MINERALS) TABS Take 1 tablet by mouth daily.   potassium chloride (K-DUR,KLOR-CON) 10 MEQ tablet Take 1 tablet by mouth daily as needed (for fluid retention when taking Lasix (Furosemide)).    risperiDONE (RISPERDAL) 1 MG tablet Take 1 mg by mouth at bedtime.    traZODone (DESYREL) 50 MG tablet Take 50 mg by mouth at bedtime.   Turmeric 500 MG CAPS Take 1 capsule by mouth daily.    No current facility-administered medications for this visit. (Other)      REVIEW OF SYSTEMS:    ALLERGIES Allergies  Allergen Reactions   Doxycycline Hives   Penicillins Hives   Quetiapine     REACTION: involuntary licking of tongue against teeth, felt really bad on med   Sulfonamide Derivatives Nausea And Vomiting    PAST MEDICAL HISTORY Past Medical History:  Diagnosis Date   Bipolar 1 disorder (HCC)    COPD (chronic obstructive pulmonary disease) (HCC)    CT chest 11/2005 Dr. Orson Aloe   Coronary artery disease    minimal bilateral disease   Hypertension    Pulmonary hypertension (HCC)    Past Surgical History:  Procedure Laterality Date   CATARACT EXTRACTION Right    CATARACT EXTRACTION W/PHACO Left 12/01/2017   Procedure: CATARACT EXTRACTION PHACO AND INTRAOCULAR LENS PLACEMENT (IOC);  Surgeon: Fabio Pierce, MD;  Location: [facility];  Service: Ophthalmology;  Laterality: Left;  CDE: 11.86   DILATION AND CURETTAGE OF UTERUS     hemrrhoidectomy      FAMILY HISTORY Family History  Problem Relation Age of Onset   Cancer Other    Coronary artery disease Other     SOCIAL HISTORY Social History   Tobacco Use   Smoking status: Never   Smokeless tobacco: Never  Vaping Use   Vaping Use: Never used  Substance Use Topics   Alcohol use: No   Drug use: Never         OPHTHALMIC EXAM:  Base Eye Exam     Visual Acuity (ETDRS)       Right Left   Dist cc CF at 3' 20/400   Dist ph cc  NI          Tonometry (Tonopen, 11:15 AM)       Right Left   Pressure 18 14         Pupils       Pupils Dark Light Shape React APD   Right PERRL 4 4 Round Minimal None   Left PERRL 4 4 Round Minimal None         Visual Fields       Left Right    Full    Restrictions  Partial outer superior temporal, inferior temporal, superior nasal, inferior nasal deficiencies         Extraocular Movement       Right Left    Full Full         Neuro/Psych     Oriented x3: Yes   Mood/Affect: Normal         Dilation     Both eyes: 1.0% Mydriacyl, 2.5% Phenylephrine @ 11:15 AM           Slit Lamp and Fundus Exam     External Exam       Right Left   External Normal Normal         Slit Lamp Exam       Right Left   Lids/Lashes Normal Normal   Conjunctiva/Sclera White and quiet White and quiet   Cornea Clear Clear   Anterior Chamber Deep and quiet Deep and quiet   Iris Round and reactive Round and reactive   Lens Centered posterior chamber intraocular lens Centered posterior chamber intraocular lens   Anterior Vitreous Normal Normal         Fundus Exam       Right Left   Posterior Vitreous Normal Normal   Disc Normal Pallor 1+   C/D Ratio 0.4 0.4   Macula  less Macular thickening, Drusen, Intermediate age related macular degeneration, Disciform scar, , Geographic atrophy subfoveal accounts for acuity of nearly 1 disc area in size Early age related macular degeneration, , Disciform scar, pigmentary atrophy   Vessels Normal Normal   Periphery Normal Normal            IMAGING AND PROCEDURES  Imaging and Procedures for 03/09/21  OCT, Retina - OU - Both Eyes       Right Eye Quality was borderline. Scan locations included subfoveal. Central Foveal Thickness: 206. Progression has improved. Findings include abnormal foveal contour.   Left Eye Quality was borderline. Scan locations included  subfoveal. Central Foveal Thickness: 362. Progression has been stable.  Findings include abnormal foveal contour, retinal drusen , subretinal fluid.   Notes OD vastly improved as compared to onset of therapy January 2022.  Now stabilized into diffuse atrophy overlying disciform scar  OS also stable with diffuse atrophy limiting acuity.  We will repeat injection today and follow-up in 10 weeks     Intravitreal Injection, Pharmacologic Agent - OS - Left Eye       Time Out 03/09/2021. 11:36 AM. Confirmed correct patient, procedure, site, and patient consented.   Anesthesia Topical anesthesia was used. Anesthetic medications included Akten 3.5%.   Procedure Preparation included Tobramycin 0.3%, 10% betadine to eyelids, 5% betadine to ocular surface. A 30 gauge needle was used.   Injection: 2.5 mg bevacizumab 2.5 MG/0.1ML   Route: Intravitreal, Site: Left Eye   NDC: [phone], Lot: [id number]   Post-op Post injection exam found visual acuity of at least counting fingers. The patient tolerated the procedure well. There were no complications. The patient received written and verbal post procedure care education. Post injection medications were not given.              ASSESSMENT/PLAN:  No problem-specific Assessment & Plan notes found for this encounter.      ICD-10-CM   1. Exudative age-related macular degeneration of left eye with active choroidal neovascularization (HCC)  H35.3221 OCT, Retina - OU - Both Eyes    Intravitreal Injection, Pharmacologic Agent - OS - Left Eye    bevacizumab (AVASTIN) SOSY 2.5 mg    2. Exudative age-related macular degeneration of right eye with active choroidal neovascularization (HCC)  H35.3211 OCT, Retina - OU - Both Eyes    CANCELED: Intravitreal Injection, Pharmacologic Agent - OD - Right Eye      1.  OD with now with subfoveal atrophy, no signs of CNVM thus will observe today  2.  OS with chronic subretinal serous fluid overlying vascularized subfoveal PED.  Stable at 10 weeks.  Repeat injection today and  examination next in 10 weeks  3.  Ophthalmic Meds Ordered this visit:  Meds ordered this encounter  Medications   bevacizumab (AVASTIN) SOSY 2.5 mg       Return in about 10 weeks (around 05/18/2021) for DILATE OU, AVASTIN OCT, OS.  There are no Patient Instructions on file for this visit.   Explained the diagnoses, plan, and follow up with the patient and they expressed understanding.  Patient expressed understanding of the importance of proper follow up care.   Alford Highland Hyde Sires M.D. Diseases & Surgery of the Retina and Vitreous [facility] 03/09/21     Abbreviations: M myopia (nearsighted); A astigmatism; H hyperopia (farsighted); P presbyopia; Mrx spectacle prescription;  CTL contact lenses; OD right eye; OS left eye; OU both eyes  XT exotropia; ET esotropia; PEK punctate epithelial keratitis; PEE punctate epithelial erosions; DES dry eye syndrome; MGD meibomian gland dysfunction; ATs artificial tears; PFAT's preservative free artificial tears; NSC nuclear sclerotic cataract; PSC posterior subcapsular cataract; ERM epi-retinal membrane; PVD posterior vitreous detachment; RD retinal detachment; DM diabetes mellitus; DR diabetic retinopathy; NPDR non-proliferative diabetic retinopathy; PDR proliferative diabetic retinopathy; CSME clinically significant macular edema; DME diabetic macular edema; dbh dot blot hemorrhages; CWS cotton wool spot; POAG primary open angle glaucoma; C/D cup-to-disc ratio; HVF humphrey visual field; GVF goldmann visual field; OCT optical coherence tomography; IOP intraocular pressure; BRVO Branch retinal vein occlusion; CRVO central retinal vein occlusion; CRAO central retinal artery occlusion;  BRAO branch retinal artery occlusion; RT retinal tear; SB scleral buckle; PPV pars plana vitrectomy; VH Vitreous hemorrhage; PRP panretinal laser photocoagulation; IVK intravitreal kenalog; VMT vitreomacular traction; MH Macular hole;  NVD neovascularization  of the disc; NVE neovascularization elsewhere; AREDS age related eye disease study; ARMD age related macular degeneration; POAG primary open angle glaucoma; EBMD epithelial/anterior basement membrane dystrophy; ACIOL anterior chamber intraocular lens; IOL intraocular lens; PCIOL posterior chamber intraocular lens; Phaco/IOL phacoemulsification with intraocular lens placement; PRK photorefractive keratectomy; LASIK laser assisted in situ keratomileusis; HTN hypertension; DM diabetes mellitus; COPD chronic obstructive pulmonary disease

## 2021-03-11 DIAGNOSIS — K219 Gastro-esophageal reflux disease without esophagitis: Secondary | ICD-10-CM | POA: Diagnosis not present

## 2021-03-11 DIAGNOSIS — I10 Essential (primary) hypertension: Secondary | ICD-10-CM | POA: Diagnosis not present

## 2021-03-11 DIAGNOSIS — F319 Bipolar disorder, unspecified: Secondary | ICD-10-CM | POA: Diagnosis not present

## 2021-03-11 DIAGNOSIS — H3530 Unspecified macular degeneration: Secondary | ICD-10-CM | POA: Diagnosis not present

## 2021-03-11 DIAGNOSIS — F0390 Unspecified dementia without behavioral disturbance: Secondary | ICD-10-CM | POA: Diagnosis not present

## 2021-03-11 DIAGNOSIS — F0391 Unspecified dementia with behavioral disturbance: Secondary | ICD-10-CM | POA: Diagnosis not present

## 2021-03-13 DIAGNOSIS — I10 Essential (primary) hypertension: Secondary | ICD-10-CM | POA: Diagnosis not present

## 2021-04-13 DIAGNOSIS — H903 Sensorineural hearing loss, bilateral: Secondary | ICD-10-CM | POA: Diagnosis not present

## 2021-04-22 DIAGNOSIS — Z23 Encounter for immunization: Secondary | ICD-10-CM | POA: Diagnosis not present

## 2021-05-04 DIAGNOSIS — H3530 Unspecified macular degeneration: Secondary | ICD-10-CM | POA: Diagnosis not present

## 2021-05-04 DIAGNOSIS — F319 Bipolar disorder, unspecified: Secondary | ICD-10-CM | POA: Diagnosis not present

## 2021-05-04 DIAGNOSIS — K219 Gastro-esophageal reflux disease without esophagitis: Secondary | ICD-10-CM | POA: Diagnosis not present

## 2021-05-04 DIAGNOSIS — J449 Chronic obstructive pulmonary disease, unspecified: Secondary | ICD-10-CM | POA: Diagnosis not present

## 2021-05-04 DIAGNOSIS — I10 Essential (primary) hypertension: Secondary | ICD-10-CM | POA: Diagnosis not present

## 2021-05-04 DIAGNOSIS — F0390 Unspecified dementia without behavioral disturbance: Secondary | ICD-10-CM | POA: Diagnosis not present

## 2021-05-05 DIAGNOSIS — W06XXXA Fall from bed, initial encounter: Secondary | ICD-10-CM | POA: Diagnosis not present

## 2021-05-05 DIAGNOSIS — I7091 Generalized atherosclerosis: Secondary | ICD-10-CM | POA: Diagnosis not present

## 2021-05-05 DIAGNOSIS — R531 Weakness: Secondary | ICD-10-CM | POA: Diagnosis not present

## 2021-05-05 DIAGNOSIS — L603 Nail dystrophy: Secondary | ICD-10-CM | POA: Diagnosis not present

## 2021-05-05 DIAGNOSIS — M5136 Other intervertebral disc degeneration, lumbar region: Secondary | ICD-10-CM | POA: Diagnosis not present

## 2021-05-06 DIAGNOSIS — H353221 Exudative age-related macular degeneration, left eye, with active choroidal neovascularization: Secondary | ICD-10-CM | POA: Diagnosis not present

## 2021-05-06 DIAGNOSIS — H353212 Exudative age-related macular degeneration, right eye, with inactive choroidal neovascularization: Secondary | ICD-10-CM | POA: Diagnosis not present

## 2021-05-06 DIAGNOSIS — H524 Presbyopia: Secondary | ICD-10-CM | POA: Diagnosis not present

## 2021-05-06 DIAGNOSIS — Z961 Presence of intraocular lens: Secondary | ICD-10-CM | POA: Diagnosis not present

## 2021-05-07 DIAGNOSIS — F0390 Unspecified dementia without behavioral disturbance: Secondary | ICD-10-CM | POA: Diagnosis not present

## 2021-05-07 DIAGNOSIS — W06XXXA Fall from bed, initial encounter: Secondary | ICD-10-CM | POA: Diagnosis not present

## 2021-05-07 DIAGNOSIS — R531 Weakness: Secondary | ICD-10-CM | POA: Diagnosis not present

## 2021-05-18 ENCOUNTER — Other Ambulatory Visit: Payer: Self-pay

## 2021-05-18 ENCOUNTER — Encounter (INDEPENDENT_AMBULATORY_CARE_PROVIDER_SITE_OTHER): Payer: Self-pay | Admitting: Ophthalmology

## 2021-05-18 ENCOUNTER — Ambulatory Visit (INDEPENDENT_AMBULATORY_CARE_PROVIDER_SITE_OTHER): Payer: Medicare Other | Admitting: Ophthalmology

## 2021-05-18 DIAGNOSIS — H353211 Exudative age-related macular degeneration, right eye, with active choroidal neovascularization: Secondary | ICD-10-CM

## 2021-05-18 DIAGNOSIS — Z23 Encounter for immunization: Secondary | ICD-10-CM | POA: Diagnosis not present

## 2021-05-18 DIAGNOSIS — H353221 Exudative age-related macular degeneration, left eye, with active choroidal neovascularization: Secondary | ICD-10-CM

## 2021-05-18 MED ORDER — BEVACIZUMAB 2.5 MG/0.1ML IZ SOSY
2.5000 mg | PREFILLED_SYRINGE | INTRAVITREAL | Status: AC | PRN
  Administered 2021-05-18: 2.5 mg via INTRAVITREAL

## 2021-05-18 NOTE — Progress Notes (Signed)
05/18/2021     CHIEF COMPLAINT Patient presents for  Chief Complaint  Patient presents with   Retina Follow Up      HISTORY OF PRESENT ILLNESS: Isabella Campbell is a 87 y.o. female who presents to the clinic today for:   HPI     Retina Follow Up   Patient presents with  Wet AMD.  In both eyes.  This started 10 weeks ago.  Severity is mild.  Duration of 10 weeks.  Since onset it is gradually worsening.        Comments   10 week fu OU oct avastin OS. Pt states her right eye has been bothering her when reading. Pt states she has a question for Dr. Luciana Axe, her question is "how much macular degeneration is in my eyes."       Last edited by Nelva Nay on 05/18/2021 11:28 AM.      Referring physician: Sherol Dade, DO [address] [place],  [place] [address]  HISTORICAL INFORMATION:   Selected notes from the MEDICAL RECORD NUMBER       CURRENT MEDICATIONS: No current outpatient medications on file. (Ophthalmic Drugs)   No current facility-administered medications for this visit. (Ophthalmic Drugs)   Current Outpatient Medications (Other)  Medication Sig   acetaminophen (TYLENOL) 325 MG tablet Take 650 mg by mouth every 6 (six) hours as needed.   Ascorbic Acid (VITAMIN C) 1000 MG tablet Take 1,000 mg by mouth daily.   atenolol (TENORMIN) 25 MG tablet Take 25 mg by mouth daily.    benztropine (COGENTIN) 0.5 MG tablet Take 0.5 mg by mouth daily.    Cholecalciferol 1000 units tablet Take 1,000 Units by mouth.   Coenzyme Q10 100 MG capsule Take 100 mg by mouth 3 (three) times daily.   divalproex (DEPAKOTE) 250 MG DR tablet Take 250 mg by mouth 2 (two) times daily.   donepezil (ARICEPT) 5 MG tablet Take 5 mg by mouth at bedtime.   furosemide (LASIX) 20 MG tablet Take 1 tablet by mouth daily as needed for fluid.    gabapentin (NEURONTIN) 100 MG capsule Take 100 mg by mouth at bedtime.   Ipratropium-Albuterol (COMBIVENT) 20-100 MCG/ACT AERS respimat Inhale  1 puff into the lungs 4 (four) times daily.   Multiple Vitamin (MULTIVITAMIN) tablet Take 1 tablet by mouth daily.   Multiple Vitamins-Minerals (EYE VITAMINS & MINERALS) TABS Take 1 tablet by mouth daily.   potassium chloride (K-DUR,KLOR-CON) 10 MEQ tablet Take 1 tablet by mouth daily as needed (for fluid retention when taking Lasix (Furosemide)).    risperiDONE (RISPERDAL) 1 MG tablet Take 1 mg by mouth at bedtime.    traZODone (DESYREL) 50 MG tablet Take 50 mg by mouth at bedtime.   Turmeric 500 MG CAPS Take 1 capsule by mouth daily.    No current facility-administered medications for this visit. (Other)      REVIEW OF SYSTEMS:    ALLERGIES Allergies  Allergen Reactions   Doxycycline Hives   Penicillins Hives   Quetiapine     REACTION: involuntary licking of tongue against teeth, felt really bad on med   Sulfonamide Derivatives Nausea And Vomiting    PAST MEDICAL HISTORY Past Medical History:  Diagnosis Date   Bipolar 1 disorder (HCC)    COPD (chronic obstructive pulmonary disease) (HCC)    CT chest 11/2005 Dr. Orson Aloe   Coronary artery disease    minimal bilateral disease   Hypertension    Pulmonary hypertension (HCC)  Past Surgical History:  Procedure Laterality Date   CATARACT EXTRACTION Right    CATARACT EXTRACTION W/PHACO Left 12/01/2017   Procedure: CATARACT EXTRACTION PHACO AND INTRAOCULAR LENS PLACEMENT (IOC);  Surgeon: Fabio Pierce, MD;  Location: [facility];  Service: Ophthalmology;  Laterality: Left;  CDE: 11.86   DILATION AND CURETTAGE OF UTERUS     hemrrhoidectomy      FAMILY HISTORY Family History  Problem Relation Age of Onset   Cancer Other    Coronary artery disease Other     SOCIAL HISTORY Social History   Tobacco Use   Smoking status: Never   Smokeless tobacco: Never  Vaping Use   Vaping Use: Never used  Substance Use Topics   Alcohol use: No   Drug use: Never         OPHTHALMIC EXAM:  Base Eye Exam     Visual Acuity  (ETDRS)       Right Left   Dist cc HM 20/70   Dist ph cc NI NI    Correction: Glasses         Tonometry (Tonopen, 11:31 AM)       Right Left   Pressure 17 11         Pupils       Pupils Dark Light Shape React APD   Right PERRL 4 4 Round Minimal Trace   Left PERRL 4 4 Round Minimal None         Extraocular Movement       Right Left    Full Full         Neuro/Psych     Oriented x3: Yes   Mood/Affect: Normal         Dilation     Both eyes: 1.0% Mydriacyl, 2.5% Phenylephrine @ 11:31 AM           Slit Lamp and Fundus Exam     External Exam       Right Left   External Normal Normal         Slit Lamp Exam       Right Left   Lids/Lashes Normal Normal   Conjunctiva/Sclera White and quiet White and quiet   Cornea Clear Clear   Anterior Chamber Deep and quiet Deep and quiet   Iris Round and reactive Round and reactive   Lens Centered posterior chamber intraocular lens Centered posterior chamber intraocular lens   Anterior Vitreous Normal Normal         Fundus Exam       Right Left   Posterior Vitreous Normal Normal   Disc Normal Pallor 1+   C/D Ratio 0.4 0.4   Macula  less Macular thickening, Drusen, Intermediate age related macular degeneration, Disciform scar, , Geographic atrophy subfoveal accounts for acuity of nearly 1 disc area in size Early age related macular degeneration, , Disciform scar, pigmentary atrophy   Vessels Normal Normal   Periphery Normal Normal            IMAGING AND PROCEDURES  Imaging and Procedures for 05/18/21  OCT, Retina - OU - Both Eyes       Right Eye Quality was borderline. Scan locations included subfoveal. Central Foveal Thickness: 208. Progression has improved. Findings include abnormal foveal contour.   Left Eye Quality was borderline. Scan locations included subfoveal. Central Foveal Thickness: 392. Progression has been stable. Findings include abnormal foveal contour, retinal drusen ,  subretinal fluid.   Notes OD vastly improved as compared to onset of  therapy January 2022.  Now stabilized into diffuse atrophy overlying disciform scar  OS also stable with diffuse atrophy limiting acuity.  We will repeat injection today and follow-up in 10 weeks     Intravitreal Injection, Pharmacologic Agent - OS - Left Eye       Time Out 05/18/2021. 11:54 AM. Confirmed correct patient, procedure, site, and patient consented.   Anesthesia Topical anesthesia was used. Anesthetic medications included Lidocaine 4%.   Procedure Preparation included Tobramycin 0.3%, 10% betadine to eyelids, 5% betadine to ocular surface. A 30 gauge needle was used.   Injection: 2.5 mg bevacizumab 2.5 MG/0.1ML   Route: Intravitreal, Site: Left Eye   NDC: [phone], Lot: [id number]   Post-op Post injection exam found visual acuity of at least counting fingers. The patient tolerated the procedure well. There were no complications. The patient received written and verbal post procedure care education. Post injection medications included ocuflox.              ASSESSMENT/PLAN:  Exudative age-related macular degeneration of left eye with active choroidal neovascularization (HCC) Improved but she still active subretinal fluid from CNVM OS controlled and stabilize acuity at 10-week interval repeat injection Avastin today and maintain 10-week follow-up interval  Exudative age-related macular degeneration of right eye with active choroidal neovascularization (HCC) OD today at 4 months postinjection we will repeat in evaluation next observed today     ICD-10-CM   1. Exudative age-related macular degeneration of right eye with active choroidal neovascularization (HCC)  H35.3211 OCT, Retina - OU - Both Eyes    Intravitreal Injection, Pharmacologic Agent - OS - Left Eye    bevacizumab (AVASTIN) SOSY 2.5 mg    2. Exudative age-related macular degeneration of left eye with active choroidal  neovascularization (HCC)  H35.3221       1.  OS, chronic active CNVM subfoveal less subretinal fluid but still active repeat injection Avastin today to maintain  2.  3.  Ophthalmic Meds Ordered this visit:  Meds ordered this encounter  Medications   bevacizumab (AVASTIN) SOSY 2.5 mg       Return in about 10 weeks (around 07/27/2021) for DILATE OU, AVASTIN OCT, OS.  There are no Patient Instructions on file for this visit.   Explained the diagnoses, plan, and follow up with the patient and they expressed understanding.  Patient expressed understanding of the importance of proper follow up care.   Alford Highland Orabelle Rylee M.D. Diseases & Surgery of the Retina and Vitreous [facility] 05/18/21     Abbreviations: M myopia (nearsighted); A astigmatism; H hyperopia (farsighted); P presbyopia; Mrx spectacle prescription;  CTL contact lenses; OD right eye; OS left eye; OU both eyes  XT exotropia; ET esotropia; PEK punctate epithelial keratitis; PEE punctate epithelial erosions; DES dry eye syndrome; MGD meibomian gland dysfunction; ATs artificial tears; PFAT's preservative free artificial tears; NSC nuclear sclerotic cataract; PSC posterior subcapsular cataract; ERM epi-retinal membrane; PVD posterior vitreous detachment; RD retinal detachment; DM diabetes mellitus; DR diabetic retinopathy; NPDR non-proliferative diabetic retinopathy; PDR proliferative diabetic retinopathy; CSME clinically significant macular edema; DME diabetic macular edema; dbh dot blot hemorrhages; CWS cotton wool spot; POAG primary open angle glaucoma; C/D cup-to-disc ratio; HVF humphrey visual field; GVF goldmann visual field; OCT optical coherence tomography; IOP intraocular pressure; BRVO Branch retinal vein occlusion; CRVO central retinal vein occlusion; CRAO central retinal artery occlusion; BRAO branch retinal artery occlusion; RT retinal tear; SB scleral buckle; PPV pars plana vitrectomy; VH Vitreous  hemorrhage; PRP  panretinal laser photocoagulation; IVK intravitreal kenalog; VMT vitreomacular traction; MH Macular hole;  NVD neovascularization of the disc; NVE neovascularization elsewhere; AREDS age related eye disease study; ARMD age related macular degeneration; POAG primary open angle glaucoma; EBMD epithelial/anterior basement membrane dystrophy; ACIOL anterior chamber intraocular lens; IOL intraocular lens; PCIOL posterior chamber intraocular lens; Phaco/IOL phacoemulsification with intraocular lens placement; [place] photorefractive keratectomy; LASIK laser assisted in situ keratomileusis; HTN hypertension; DM diabetes mellitus; COPD chronic obstructive pulmonary disease

## 2021-05-18 NOTE — Assessment & Plan Note (Signed)
Improved but she still active subretinal fluid from CNVM OS controlled and stabilize acuity at 10-week interval repeat injection Avastin today and maintain 10-week follow-up interval

## 2021-05-18 NOTE — Assessment & Plan Note (Signed)
OD today at 4 months postinjection we will repeat in evaluation next observed today

## 2021-06-17 DIAGNOSIS — N390 Urinary tract infection, site not specified: Secondary | ICD-10-CM | POA: Diagnosis not present

## 2021-06-18 DIAGNOSIS — R300 Dysuria: Secondary | ICD-10-CM | POA: Diagnosis not present

## 2021-07-01 DIAGNOSIS — K219 Gastro-esophageal reflux disease without esophagitis: Secondary | ICD-10-CM | POA: Diagnosis not present

## 2021-07-01 DIAGNOSIS — F319 Bipolar disorder, unspecified: Secondary | ICD-10-CM | POA: Diagnosis not present

## 2021-07-01 DIAGNOSIS — G4700 Insomnia, unspecified: Secondary | ICD-10-CM | POA: Diagnosis not present

## 2021-07-01 DIAGNOSIS — H3530 Unspecified macular degeneration: Secondary | ICD-10-CM | POA: Diagnosis not present

## 2021-07-01 DIAGNOSIS — J449 Chronic obstructive pulmonary disease, unspecified: Secondary | ICD-10-CM | POA: Diagnosis not present

## 2021-07-01 DIAGNOSIS — F0390 Unspecified dementia without behavioral disturbance: Secondary | ICD-10-CM | POA: Diagnosis not present

## 2021-07-01 DIAGNOSIS — I10 Essential (primary) hypertension: Secondary | ICD-10-CM | POA: Diagnosis not present

## 2021-07-07 DIAGNOSIS — L84 Corns and callosities: Secondary | ICD-10-CM | POA: Diagnosis not present

## 2021-07-07 DIAGNOSIS — L603 Nail dystrophy: Secondary | ICD-10-CM | POA: Diagnosis not present

## 2021-07-07 DIAGNOSIS — I7091 Generalized atherosclerosis: Secondary | ICD-10-CM | POA: Diagnosis not present

## 2021-07-27 ENCOUNTER — Other Ambulatory Visit: Payer: Self-pay

## 2021-07-27 ENCOUNTER — Ambulatory Visit (INDEPENDENT_AMBULATORY_CARE_PROVIDER_SITE_OTHER): Payer: Medicare Other | Admitting: Ophthalmology

## 2021-07-27 ENCOUNTER — Encounter (INDEPENDENT_AMBULATORY_CARE_PROVIDER_SITE_OTHER): Payer: Self-pay | Admitting: Ophthalmology

## 2021-07-27 DIAGNOSIS — H353211 Exudative age-related macular degeneration, right eye, with active choroidal neovascularization: Secondary | ICD-10-CM

## 2021-07-27 DIAGNOSIS — H353221 Exudative age-related macular degeneration, left eye, with active choroidal neovascularization: Secondary | ICD-10-CM

## 2021-07-27 MED ORDER — BEVACIZUMAB 2.5 MG/0.1ML IZ SOSY
2.5000 mg | PREFILLED_SYRINGE | INTRAVITREAL | Status: AC | PRN
  Administered 2021-07-27: 2.5 mg via INTRAVITREAL

## 2021-07-27 NOTE — Assessment & Plan Note (Signed)
Vastly improved OS with less subretinal fluid now at 10-week interval and improved acuity.  Repeat injection OS today to stabilize

## 2021-07-27 NOTE — Assessment & Plan Note (Signed)
No longer active OD, diffuse atrophy remains.  Continue to observe OD.

## 2021-07-27 NOTE — Progress Notes (Signed)
07/27/2021     CHIEF COMPLAINT Patient presents for  Chief Complaint  Patient presents with   Retina Follow Up    Follow-up of wet AMD, OS, active, now at 10-week follow-up.  Patient notes improved acuity.  HISTORY OF PRESENT ILLNESS: Isabella Campbell is a [age] y.o. female who presents to the clinic today for:   HPI     Retina Follow Up           Diagnosis: Wet AMD   Laterality: both eyes   Onset: 10 weeks ago   Severity: mild   Duration: 10 weeks   Course: gradually worsening         Comments   10 week fu OU oct Avastin OS. Pt states no change in vision, denies new FOL or floaters. Pt states she has "matter in eye, crust in eye."       Last edited by Nelva NayKronstein, Anna N on 07/27/2021 10:46 AM.      Referring physician: Sherol DadeSimpson-Tarokh, Leann, DO [address] [place],  [place] [id number]  HISTORICAL INFORMATION:   Selected notes from the MEDICAL RECORD NUMBER       CURRENT MEDICATIONS: No current outpatient medications on file. (Ophthalmic Drugs)   No current facility-administered medications for this visit. (Ophthalmic Drugs)   Current Outpatient Medications (Other)  Medication Sig   acetaminophen (TYLENOL) 325 MG tablet Take 650 mg by mouth every 6 (six) hours as needed.   Ascorbic Acid (VITAMIN C) 1000 MG tablet Take 1,000 mg by mouth daily.   atenolol (TENORMIN) 25 MG tablet Take 25 mg by mouth daily.    benztropine (COGENTIN) 0.5 MG tablet Take 0.5 mg by mouth daily.    Cholecalciferol 1000 units tablet Take 1,000 Units by mouth.   Coenzyme Q10 100 MG capsule Take 100 mg by mouth 3 (three) times daily.   divalproex (DEPAKOTE) 250 MG DR tablet Take 250 mg by mouth 2 (two) times daily.   donepezil (ARICEPT) 5 MG tablet Take 5 mg by mouth at bedtime.   furosemide (LASIX) 20 MG tablet Take 1 tablet by mouth daily as needed for fluid.    gabapentin (NEURONTIN) 100 MG capsule Take 100 mg by mouth at bedtime.   Ipratropium-Albuterol (COMBIVENT) 20-100  MCG/ACT AERS respimat Inhale 1 puff into the lungs 4 (four) times daily.   Multiple Vitamin (MULTIVITAMIN) tablet Take 1 tablet by mouth daily.   Multiple Vitamins-Minerals (EYE VITAMINS & MINERALS) TABS Take 1 tablet by mouth daily.   potassium chloride (K-DUR,KLOR-CON) 10 MEQ tablet Take 1 tablet by mouth daily as needed (for fluid retention when taking Lasix (Furosemide)).    risperiDONE (RISPERDAL) 1 MG tablet Take 1 mg by mouth at bedtime.    traZODone (DESYREL) 50 MG tablet Take 50 mg by mouth at bedtime.   Turmeric 500 MG CAPS Take 1 capsule by mouth daily.    No current facility-administered medications for this visit. (Other)      REVIEW OF SYSTEMS: ROS   Negative for: Constitutional, Gastrointestinal, Neurological, Skin, Genitourinary, Musculoskeletal, HENT, Endocrine, Cardiovascular, Eyes, Respiratory, Psychiatric, Allergic/Imm, Heme/Lymph Last edited by Edmon Crapeankin, Sashia Campas A, MD on 07/27/2021 11:21 AM.       ALLERGIES Allergies  Allergen Reactions   Doxycycline Hives   Penicillins Hives   Quetiapine     REACTION: involuntary licking of tongue against teeth, felt really bad on med   Sulfonamide Derivatives Nausea And Vomiting    PAST MEDICAL HISTORY Past Medical History:  Diagnosis Date  Bipolar 1 disorder (HCC)    COPD (chronic obstructive pulmonary disease) (HCC)    CT chest 11/2005 Dr. Orson AloeHenderson   Coronary artery disease    minimal bilateral disease   Hypertension    Pulmonary hypertension [facility](HCC)    Past Surgical History:  Procedure Laterality Date   CATARACT EXTRACTION Right    CATARACT EXTRACTION W/PHACO Left 12/01/2017   Procedure: CATARACT EXTRACTION PHACO AND INTRAOCULAR LENS PLACEMENT (IOC);  Surgeon: Fabio PierceWrzosek, James, MD;  Location: [facility];  Service: Ophthalmology;  Laterality: Left;  CDE: 11.86   DILATION AND CURETTAGE OF UTERUS     hemrrhoidectomy      FAMILY HISTORY Family History  Problem Relation Age of Onset   Cancer Other    Coronary artery  disease Other     SOCIAL HISTORY Social History   Tobacco Use   Smoking status: Never   Smokeless tobacco: Never  Vaping Use   Vaping Use: Never used  Substance Use Topics   Alcohol use: No   Drug use: Never         OPHTHALMIC EXAM:  Base Eye Exam     Visual Acuity (ETDRS)       Right Left   Dist cc HM 20/70   Dist ph cc NI NI         Tonometry (Tonopen, 10:50 AM)       Right Left   Pressure 20 14         Pupils       Pupils Dark Light React APD   Right PERRL 4 4 Minimal None   Left PERRL 4 4 Minimal None         Extraocular Movement       Right Left    Full Full         Neuro/Psych     Oriented x3: Yes   Mood/Affect: Normal         Dilation     Both eyes: 1.0% Mydriacyl, 2.5% Phenylephrine @ 10:50 AM           Slit Lamp and Fundus Exam     External Exam       Right Left   External Normal Normal         Slit Lamp Exam       Right Left   Lids/Lashes Normal Normal   Conjunctiva/Sclera White and quiet White and quiet   Cornea Clear Clear   Anterior Chamber Deep and quiet Deep and quiet   Iris Round and reactive Round and reactive   Lens Centered posterior chamber intraocular lens Centered posterior chamber intraocular lens   Anterior Vitreous Normal Normal         Fundus Exam       Right Left   Posterior Vitreous Normal Normal   Disc Normal Pallor 1+   C/D Ratio 0.4 0.4   Macula less Macular thickening, Drusen, Intermediate age related macular degeneration, Disciform scar, , Geographic atrophy subfoveal accounts for acuity of nearly 1 disc area in size Early age related macular degeneration, , Disciform scar, pigmentary atrophy   Vessels Normal Normal   Periphery Normal Normal            IMAGING AND PROCEDURES  Imaging and Procedures for 07/27/21  Intravitreal Injection, Pharmacologic Agent - OS - Left Eye       Time Out 07/27/2021. 11:23 AM. Confirmed correct patient, procedure, site, and patient  consented.   Anesthesia Topical anesthesia was used. Anesthetic medications included Lidocaine  4%.   Procedure Preparation included Tobramycin 0.3%, 10% betadine to eyelids, 5% betadine to ocular surface. A 30 gauge needle was used.   Injection: 2.5 mg bevacizumab 2.5 MG/0.1ML   Route: Intravitreal, Site: Left Eye   NDC: [phone], Lot: [id number]   Post-op Post injection exam found visual acuity of at least counting fingers. The patient tolerated the procedure well. There were no complications. The patient received written and verbal post procedure care education. Post injection medications included ocuflox.      OCT, Retina - OU - Both Eyes       Right Eye Quality was borderline. Scan locations included subfoveal. Central Foveal Thickness: 208. Progression has improved. Findings include abnormal foveal contour.   Left Eye Quality was borderline. Scan locations included subfoveal. Central Foveal Thickness: 331. Progression has been stable. Findings include abnormal foveal contour, retinal drusen , subretinal fluid.   Notes OD vastly improved as compared to onset of therapy January 2022.  Now stabilized into diffuse atrophy overlying disciform scar  OS also stable with diffuse atrophy limiting acuity.  We will repeat injection today and follow-up in 10 weeks             ASSESSMENT/PLAN:  Exudative age-related macular degeneration of left eye with active choroidal neovascularization (HCC) Vastly improved OS with less subretinal fluid now at 10-week interval and improved acuity.  Repeat injection OS today to stabilize  Exudative age-related macular degeneration of right eye with active choroidal neovascularization (HCC) No longer active OD, diffuse atrophy remains.  Continue to observe OD.     ICD-10-CM   1. Exudative age-related macular degeneration of left eye with active choroidal neovascularization (HCC)  H35.3221 Intravitreal Injection, Pharmacologic Agent - OS -  Left Eye    OCT, Retina - OU - Both Eyes    bevacizumab (AVASTIN) SOSY 2.5 mg    2. Exudative age-related macular degeneration of right eye with active choroidal neovascularization (HCC)  H35.3211       1.  OS clinically and by OCT evaluation improved wet AMD with less subretinal fluid improved acuity.  10-week follow-up interval post Avastin.  Repeat injection today to maintain and prevent progression of disease  2.  OD and active disease from wet AMD, continue to observe  3.  Ophthalmic Meds Ordered this visit:  Meds ordered this encounter  Medications   bevacizumab (AVASTIN) SOSY 2.5 mg       Return in about 10 weeks (around 10/05/2021) for DILATE OU, AVASTIN OCT, OS.  There are no Patient Instructions on file for this visit.   Explained the diagnoses, plan, and follow up with the patient and they expressed understanding.  Patient expressed understanding of the importance of proper follow up care.   Alford Highland Graham Doukas M.D. Diseases & Surgery of the Retina and Vitreous [facility] 07/27/21     Abbreviations: M myopia (nearsighted); A astigmatism; H hyperopia (farsighted); P presbyopia; Mrx spectacle prescription;  CTL contact lenses; OD right eye; OS left eye; OU both eyes  XT exotropia; ET esotropia; PEK punctate epithelial keratitis; PEE punctate epithelial erosions; DES dry eye syndrome; MGD meibomian gland dysfunction; ATs artificial tears; PFAT's preservative free artificial tears; NSC nuclear sclerotic cataract; PSC posterior subcapsular cataract; ERM epi-retinal membrane; PVD posterior vitreous detachment; RD retinal detachment; DM diabetes mellitus; DR diabetic retinopathy; NPDR non-proliferative diabetic retinopathy; PDR proliferative diabetic retinopathy; CSME clinically significant macular edema; DME diabetic macular edema; dbh dot blot hemorrhages; CWS cotton wool spot; POAG primary open angle  glaucoma; C/D cup-to-disc ratio; HVF humphrey visual field;  GVF goldmann visual field; OCT optical coherence tomography; IOP intraocular pressure; BRVO Branch retinal vein occlusion; CRVO central retinal vein occlusion; CRAO central retinal artery occlusion; BRAO branch retinal artery occlusion; RT retinal tear; SB scleral buckle; PPV pars plana vitrectomy; VH Vitreous hemorrhage; PRP panretinal laser photocoagulation; IVK intravitreal kenalog; VMT vitreomacular traction; MH Macular hole;  NVD neovascularization of the disc; NVE neovascularization elsewhere; AREDS age related eye disease study; ARMD age related macular degeneration; POAG primary open angle glaucoma; EBMD epithelial/anterior basement membrane dystrophy; ACIOL anterior chamber intraocular lens; IOL intraocular lens; PCIOL posterior chamber intraocular lens; Phaco/IOL phacoemulsification with intraocular lens placement; PRK photorefractive keratectomy; LASIK laser assisted in situ keratomileusis; HTN hypertension; DM diabetes mellitus; COPD chronic obstructive pulmonary disease

## 2021-10-05 ENCOUNTER — Encounter (INDEPENDENT_AMBULATORY_CARE_PROVIDER_SITE_OTHER): Payer: Medicare Other | Admitting: Ophthalmology

## 2021-10-20 ENCOUNTER — Encounter (INDEPENDENT_AMBULATORY_CARE_PROVIDER_SITE_OTHER): Payer: Self-pay | Admitting: Ophthalmology

## 2021-10-20 ENCOUNTER — Ambulatory Visit (INDEPENDENT_AMBULATORY_CARE_PROVIDER_SITE_OTHER): Payer: Medicare Other | Admitting: Ophthalmology

## 2021-10-20 ENCOUNTER — Other Ambulatory Visit: Payer: Self-pay

## 2021-10-20 DIAGNOSIS — H353114 Nonexudative age-related macular degeneration, right eye, advanced atrophic with subfoveal involvement: Secondary | ICD-10-CM

## 2021-10-20 DIAGNOSIS — H353211 Exudative age-related macular degeneration, right eye, with active choroidal neovascularization: Secondary | ICD-10-CM | POA: Diagnosis not present

## 2021-10-20 DIAGNOSIS — H353221 Exudative age-related macular degeneration, left eye, with active choroidal neovascularization: Secondary | ICD-10-CM

## 2021-10-20 MED ORDER — BEVACIZUMAB 2.5 MG/0.1ML IZ SOSY
2.5000 mg | PREFILLED_SYRINGE | INTRAVITREAL | Status: AC | PRN
  Administered 2021-10-20: 2.5 mg via INTRAVITREAL

## 2021-10-20 NOTE — Assessment & Plan Note (Signed)
OD, no longer active CNVM will observe ?

## 2021-10-20 NOTE — Assessment & Plan Note (Signed)
The nature of wet macular degeneration was discussed with the patient.  Forms of therapy reviewed include the use of Anti-VEGF medications injected painlessly into the eye, as well as other possible treatment modalities, including thermal laser therapy. Fellow eye involvement and risks were discussed with the patient. Upon the finding of wet age related macular degeneration, treatment will be offered. The treatment regimen is on a treat as needed basis with the intent to treat if necessary and extend interval of exams when possible. On average 1 out of 6 patients do not need lifetime therapy. However, the risk of recurrent disease is high for a lifetime.  Initially monthly, then periodic, examinations and evaluations will determine whether the next treatment is required on the day of the examination. ?Persistent subretinal fluid OS and monocular patient will repeat injection today Avastin and follow-up again in 10 weeks to maintain stability ?

## 2021-10-20 NOTE — Progress Notes (Signed)
? ? ?10/20/2021 ? ?  ? ?CHIEF COMPLAINT ?Patient presents for  ?Chief Complaint  ?Patient presents with  ? Macular Degeneration  ? ? ?With atrophic changes centrally OD and a history of wet AMD OS currently a 10-week follow-up interval nodule but 12-week actual ? ?HISTORY OF PRESENT ILLNESS: ?Isabella Campbell is a 88 y.o. female who presents to the clinic today for:  ? ?HPI   ?12 weeks for DILATE OU, AVASTIN OCT, OS. ?Pt stated blurry vision in the right eye. Left eye seems to be doing better but pt stated that when she is reading, her eyes gets teary and blurred. She stated that it feels like something is in her right eye. ?Pt denies floaters and FOL. ? ?Last edited by Silvestre Moment on 10/20/2021  8:39 AM.  ?  ? ? ?Referring physician: ?Caprice Renshaw, MD ?[address] ?[place],  [place] [address] ? ?HISTORICAL INFORMATION:  ? ?Selected notes from the [facility] ?  ?   ? ?CURRENT MEDICATIONS: ?No current outpatient medications on file. (Ophthalmic Drugs)  ? ?No current facility-administered medications for this visit. (Ophthalmic Drugs)  ? ?Current Outpatient Medications (Other)  ?Medication Sig  ? acetaminophen (TYLENOL) 325 MG tablet Take 650 mg by mouth every 6 (six) hours as needed.  ? Ascorbic Acid (VITAMIN C) 1000 MG tablet Take 1,000 mg by mouth daily.  ? atenolol (TENORMIN) 25 MG tablet Take 25 mg by mouth daily.   ? benztropine (COGENTIN) 0.5 MG tablet Take 0.5 mg by mouth daily.   ? Cholecalciferol 1000 units tablet Take 1,000 Units by mouth.  ? Coenzyme Q10 100 MG capsule Take 100 mg by mouth 3 (three) times daily.  ? divalproex (DEPAKOTE) 250 MG DR tablet Take 250 mg by mouth 2 (two) times daily.  ? donepezil (ARICEPT) 5 MG tablet Take 5 mg by mouth at bedtime.  ? furosemide (LASIX) 20 MG tablet Take 1 tablet by mouth daily as needed for fluid.   ? gabapentin (NEURONTIN) 100 MG capsule Take 100 mg by mouth at bedtime.  ? Ipratropium-Albuterol (COMBIVENT) 20-100 MCG/ACT AERS respimat Inhale 1 puff into the lungs 4  (four) times daily.  ? Multiple Vitamin (MULTIVITAMIN) tablet Take 1 tablet by mouth daily.  ? Multiple Vitamins-Minerals (EYE VITAMINS & MINERALS) TABS Take 1 tablet by mouth daily.  ? potassium chloride (K-DUR,KLOR-CON) 10 MEQ tablet Take 1 tablet by mouth daily as needed (for fluid retention when taking Lasix (Furosemide)).   ? risperiDONE (RISPERDAL) 1 MG tablet Take 1 mg by mouth at bedtime.   ? traZODone (DESYREL) 50 MG tablet Take 50 mg by mouth at bedtime.  ? Turmeric 500 MG CAPS Take 1 capsule by mouth daily.   ? ?No current facility-administered medications for this visit. (Other)  ? ? ? ? ?REVIEW OF SYSTEMS: ?ROS   ?Negative for: Constitutional, Gastrointestinal, Neurological, Skin, Genitourinary, Musculoskeletal, HENT, Endocrine, Cardiovascular, Eyes, Respiratory, Psychiatric, Allergic/Imm, Heme/Lymph ?Last edited by Silvestre Moment on 10/20/2021  8:39 AM.  ?  ? ? ? ?ALLERGIES ?Allergies  ?Allergen Reactions  ? Doxycycline Hives  ? Penicillins Hives  ? Quetiapine   ?  REACTION: involuntary licking of tongue against teeth, felt really bad on med  ? Sulfonamide Derivatives Nausea And Vomiting  ? ? ?PAST MEDICAL HISTORY ?Past Medical History:  ?Diagnosis Date  ? Bipolar 1 disorder ([place])   ? COPD (chronic obstructive pulmonary disease) ([place])   ? CT chest 11/2005 Dr. Koleen Nimrod  ? Coronary artery disease   ?  minimal bilateral disease  ? Hypertension   ? Pulmonary hypertension ([place])   ? ?Past Surgical History:  ?Procedure Laterality Date  ? CATARACT EXTRACTION Right   ? CATARACT EXTRACTION W/PHACO Left 12/01/2017  ? Procedure: CATARACT EXTRACTION PHACO AND INTRAOCULAR LENS PLACEMENT (IOC);  Surgeon: Baruch Goldmann, MD;  Location: [facility];  Service: Ophthalmology;  Laterality: Left;  CDE: 11.86  ? DILATION AND CURETTAGE OF UTERUS    ? hemrrhoidectomy    ? ? ?FAMILY HISTORY ?Family History  ?Problem Relation Age of Onset  ? Cancer Other   ? Coronary artery disease Other   ? ? ?SOCIAL HISTORY ?Social History  ? ?Tobacco Use  ?  Smoking status: Never  ? Smokeless tobacco: Never  ?Vaping Use  ? Vaping Use: Never used  ?Substance Use Topics  ? Alcohol use: No  ? Drug use: Never  ? ?  ? ?  ? ?OPHTHALMIC EXAM: ? ?Base Eye Exam   ? ? Visual Acuity (ETDRS)   ? ?   Right Left  ? Dist cc HM 20/50  ? ? Correction: Glasses  ? ?  ?  ? ? Tonometry (Tonopen, 8:45 AM)   ? ?   Right Left  ? Pressure 21 15  ? ?  ?  ? ? Pupils   ? ?   Pupils APD  ? Right PERRL None  ? Left PERRL None  ? ?  ?  ? ? Visual Fields   ? ?   Left Right  ?  Full Full  ? ?  ?  ? ? Extraocular Movement   ? ?   Right Left  ?  Full Full  ? ?  ?  ? ? Neuro/Psych   ? ? Oriented x3: Yes  ? Mood/Affect: Normal  ? ?  ?  ? ? Dilation   ? ? Both eyes: 1.0% Mydriacyl, 2.5% Phenylephrine @ 8:45 AM  ? ?  ?  ? ?  ? ?Slit Lamp and Fundus Exam   ? ? External Exam   ? ?   Right Left  ? External Normal Normal  ? ?  ?  ? ? Slit Lamp Exam   ? ?   Right Left  ? Lids/Lashes Normal Normal  ? Conjunctiva/Sclera White and quiet White and quiet  ? Cornea Clear Clear  ? Anterior Chamber Deep and quiet Deep and quiet  ? Iris Round and reactive Round and reactive  ? Lens Centered posterior chamber intraocular lens Centered posterior chamber intraocular lens  ? Anterior Vitreous Normal Normal  ? ?  ?  ? ? Fundus Exam   ? ?   Right Left  ? Posterior Vitreous Normal Normal  ? Disc Normal Pallor 1+  ? C/D Ratio 0.4 0.4  ? Macula less Macular thickening, Drusen, Intermediate age related macular degeneration, Disciform scar, , Geographic atrophy subfoveal accounts for acuity of nearly 1 disc area in size Early age related macular degeneration, , Disciform scar, pigmentary atrophy  ? Vessels Normal Normal  ? Periphery Normal Normal  ? ?  ?  ? ?  ? ? ?IMAGING AND PROCEDURES  ?Imaging and Procedures for 10/20/21 ? ?OCT, Retina - OU - Both Eyes   ? ?   ?Right Eye ?Quality was borderline. Scan locations included temporal. Progression has been stable. Findings include abnormal foveal contour, central retinal atrophy, outer  retinal atrophy, inner retinal atrophy.  ? ?Left Eye ?Quality was borderline. Scan locations included subfoveal. Progression has been stable. Findings  include abnormal foveal contour, subretinal fluid.  ? ?Notes ?OS stable at 10-week interval post injection with preserved acuity in fact improved.  We will maintain condition by repeating Avastin OS today to maintaining 10-week interval to maintain lower or decrease treatment burden ? ?  ? ?Intravitreal Injection, Pharmacologic Agent - OS - Left Eye   ? ?   ?Time Out ?10/20/2021. 9:28 AM. Confirmed correct patient, procedure, site, and patient consented.  ? ?Anesthesia ?Topical anesthesia was used. Anesthetic medications included Lidocaine 4%.  ? ?Procedure ?Preparation included Tobramycin 0.3%, 10% betadine to eyelids, 5% betadine to ocular surface. A 30 gauge needle was used.  ? ?Injection: ?2.5 mg bevacizumab 2.5 MG/0.1ML ?  Route: Intravitreal, Site: Left Eye ?  [place]: [id number], [id number]  ? ?Post-op ?Post injection exam found visual acuity of at least counting fingers. The patient tolerated the procedure well. There were no complications. The patient received written and verbal post procedure care education. Post injection medications included ocuflox.  ? ?  ? ? ?  ?  ? ?  ?ASSESSMENT/PLAN: ? ?Exudative age-related macular degeneration of left eye with active choroidal neovascularization ([place]) ?The nature of wet macular degeneration was discussed with the patient.  Forms of therapy reviewed include the use of Anti-VEGF medications injected painlessly into the eye, as well as other possible treatment modalities, including thermal laser therapy. Fellow eye involvement and risks were discussed with the patient. Upon the finding of wet age related macular degeneration, treatment will be offered. The treatment regimen is on a treat as needed basis with the intent to treat if necessary and extend interval of exams when possible. On average 1 out of 6 patients do not  need lifetime therapy. However, the risk of recurrent disease is high for a lifetime.  Initially monthly, then periodic, examinations and evaluations will determine whether the next treatment is r

## 2021-10-20 NOTE — Assessment & Plan Note (Signed)
Central geographic atrophy accounts for acuity 

## 2021-11-09 IMAGING — DX DG CHEST 1V PORT
1 series · 1 of 1 positions shown · non-contrast
Comparison: None.

CLINICAL DATA: UTI and sepsis

EXAM:
PORTABLE CHEST 1 VIEW

[chest ap]
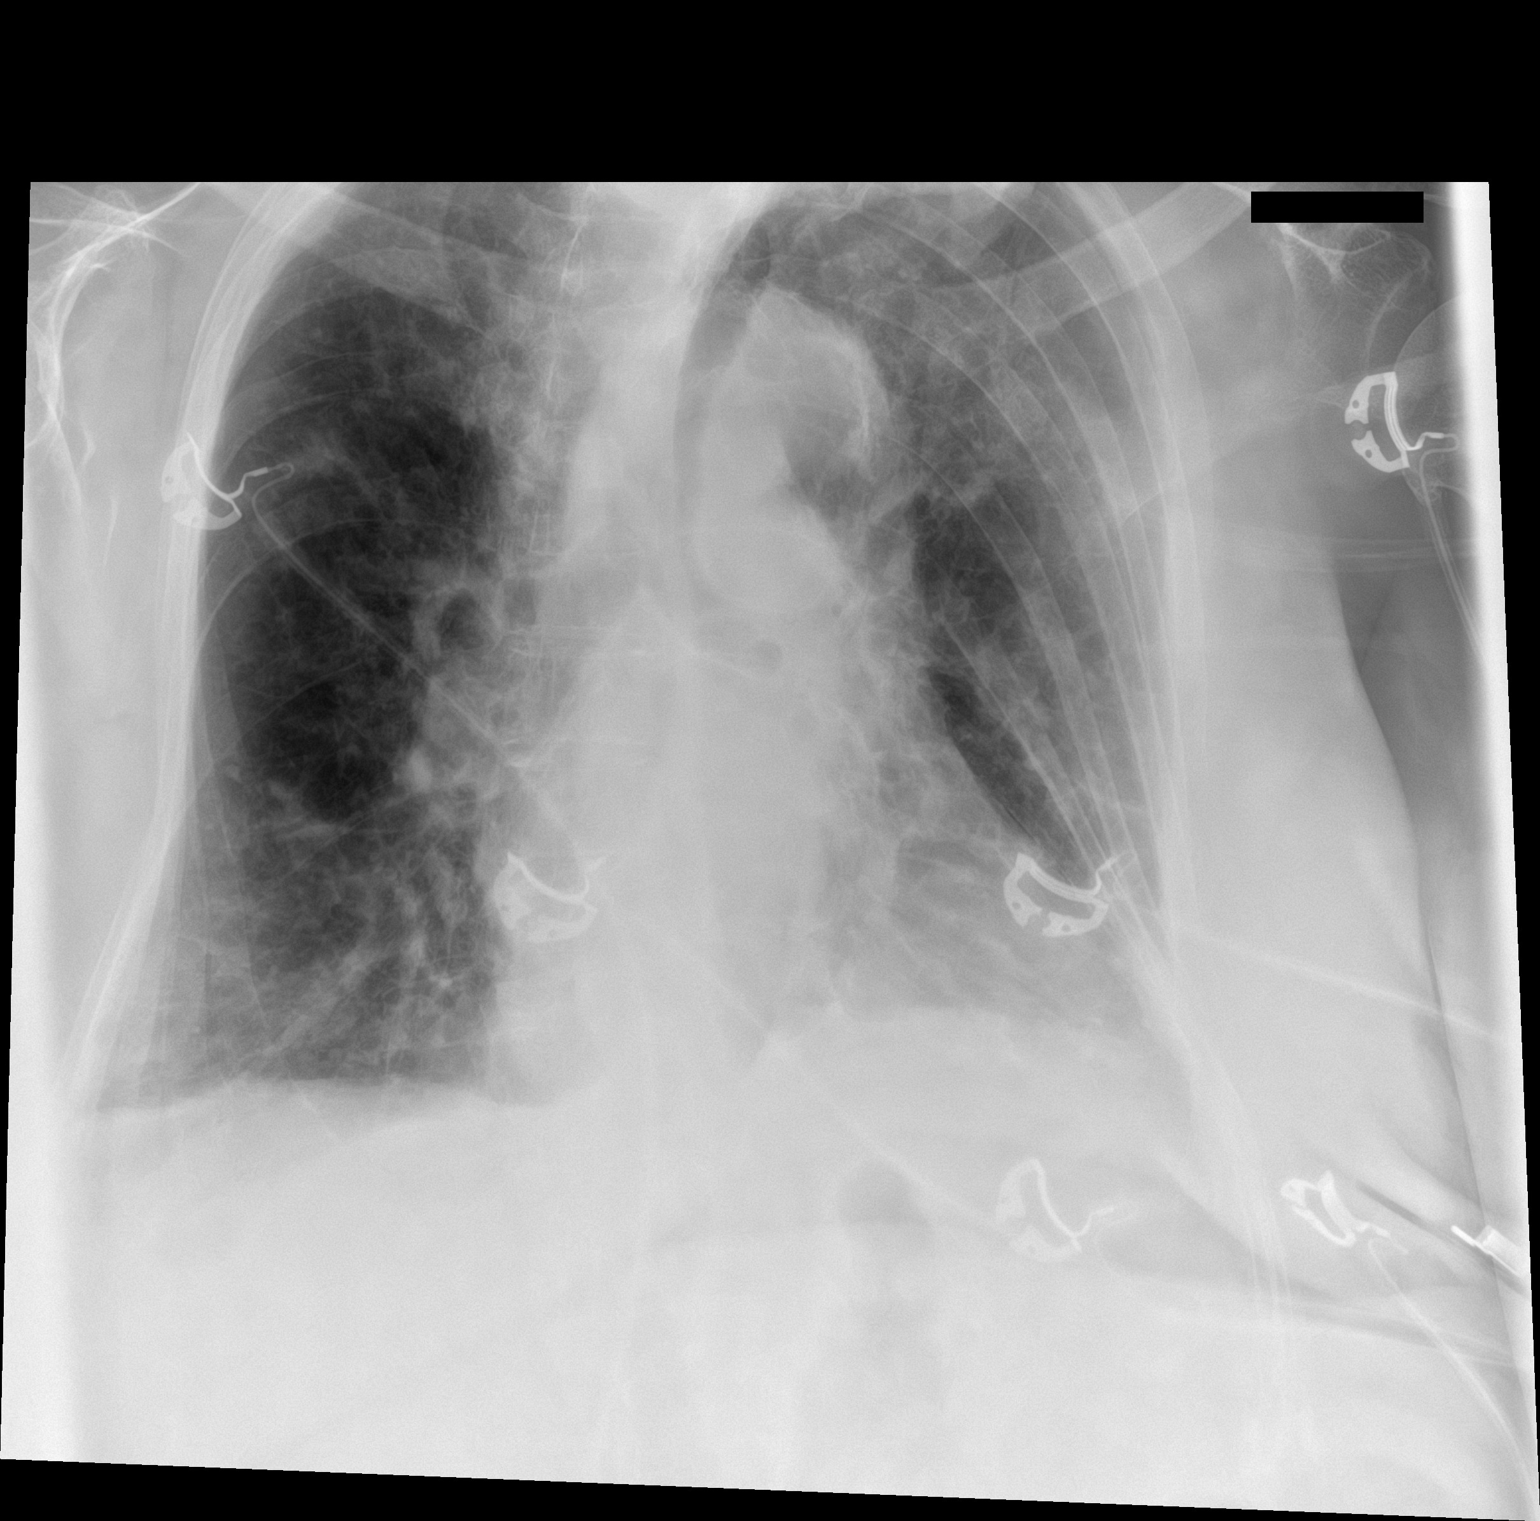

[1 of 1 positions shown; findings below may reference images not displayed]

FINDINGS: Lungs are mildly hyperinflated. There is bibasilar scarring without
focal consolidation. No pneumothorax or sizable pleural effusion.
Mild cardiomegaly. Diffuse interstitial coarsening.
IMPRESSION: COPD without acute airspace disease.

## 2021-12-29 ENCOUNTER — Encounter (INDEPENDENT_AMBULATORY_CARE_PROVIDER_SITE_OTHER): Payer: Self-pay | Admitting: Ophthalmology

## 2021-12-29 ENCOUNTER — Ambulatory Visit (INDEPENDENT_AMBULATORY_CARE_PROVIDER_SITE_OTHER): Payer: Medicare Other | Admitting: Ophthalmology

## 2021-12-29 DIAGNOSIS — H353221 Exudative age-related macular degeneration, left eye, with active choroidal neovascularization: Secondary | ICD-10-CM | POA: Diagnosis not present

## 2021-12-29 DIAGNOSIS — H353212 Exudative age-related macular degeneration, right eye, with inactive choroidal neovascularization: Secondary | ICD-10-CM | POA: Diagnosis not present

## 2021-12-29 DIAGNOSIS — H3320 Serous retinal detachment, unspecified eye: Secondary | ICD-10-CM | POA: Insufficient documentation

## 2021-12-29 DIAGNOSIS — H3322 Serous retinal detachment, left eye: Secondary | ICD-10-CM

## 2021-12-29 MED ORDER — BEVACIZUMAB 2.5 MG/0.1ML IZ SOSY
2.5000 mg | PREFILLED_SYRINGE | INTRAVITREAL | Status: AC | PRN
  Administered 2021-12-29: 2.5 mg via INTRAVITREAL

## 2021-12-29 NOTE — Assessment & Plan Note (Signed)
OS, improved overall and stable.  Chronic subretinal fluid persists even at 10 weeks post Avastin but with good acuity will continue to treat at longer intervals of therapy to diminish treatment burden upon this ambulatory challenged patient

## 2021-12-29 NOTE — Assessment & Plan Note (Signed)
No active disease in active disciform

## 2021-12-29 NOTE — Assessment & Plan Note (Addendum)
Component of wet AMD OS 

## 2021-12-29 NOTE — Progress Notes (Signed)
12/29/2021     CHIEF COMPLAINT Patient presents for  Chief Complaint  Patient presents with   Macular Degeneration      HISTORY OF PRESENT ILLNESS: Isabella Campbell is a 88 y.o. female who presents to the clinic today for:   HPI   10 weeks dilate OU, OCT Avastin OCT OS. Patient reports "my right eye is bothering me, I can't see good out of it and it waters and there is matter in that eye. It bothers me all the time." Patient reports Dr. Zadie Rhine is the only eye doctor she sees. Denies new FOL or floaters. Last edited by Laurin Coder on 12/29/2021 10:09 AM.      Referring physician: Caprice Renshaw, MD [place],  [place] [address]  HISTORICAL INFORMATION:   Selected notes from the MEDICAL RECORD NUMBER       CURRENT MEDICATIONS: No current outpatient medications on file. (Ophthalmic Drugs)   No current facility-administered medications for this visit. (Ophthalmic Drugs)   Current Outpatient Medications (Other)  Medication Sig   acetaminophen (TYLENOL) 325 MG tablet Take 650 mg by mouth every 6 (six) hours as needed.   Ascorbic Acid (VITAMIN C) 1000 MG tablet Take 1,000 mg by mouth daily.   atenolol (TENORMIN) 25 MG tablet Take 25 mg by mouth daily.    benztropine (COGENTIN) 0.5 MG tablet Take 0.5 mg by mouth daily.    Cholecalciferol 1000 units tablet Take 1,000 Units by mouth.   Coenzyme Q10 100 MG capsule Take 100 mg by mouth 3 (three) times daily.   divalproex (DEPAKOTE) 250 MG DR tablet Take 250 mg by mouth 2 (two) times daily.   donepezil (ARICEPT) 5 MG tablet Take 5 mg by mouth at bedtime.   furosemide (LASIX) 20 MG tablet Take 1 tablet by mouth daily as needed for fluid.    gabapentin (NEURONTIN) 100 MG capsule Take 100 mg by mouth at bedtime.   Ipratropium-Albuterol (COMBIVENT) 20-100 MCG/ACT AERS respimat Inhale 1 puff into the lungs 4 (four) times daily.   Multiple Vitamin (MULTIVITAMIN) tablet Take 1 tablet by mouth daily.   Multiple  Vitamins-Minerals (EYE VITAMINS & MINERALS) TABS Take 1 tablet by mouth daily.   potassium chloride (K-DUR,KLOR-CON) 10 MEQ tablet Take 1 tablet by mouth daily as needed (for fluid retention when taking Lasix (Furosemide)).    risperiDONE (RISPERDAL) 1 MG tablet Take 1 mg by mouth at bedtime.    traZODone (DESYREL) 50 MG tablet Take 50 mg by mouth at bedtime.   Turmeric 500 MG CAPS Take 1 capsule by mouth daily.    No current facility-administered medications for this visit. (Other)      REVIEW OF SYSTEMS: ROS   Negative for: Constitutional, Gastrointestinal, Neurological, Skin, Genitourinary, Musculoskeletal, HENT, Endocrine, Cardiovascular, Eyes, Respiratory, Psychiatric, Allergic/Imm, Heme/Lymph Last edited by Hurman Horn, MD on 12/29/2021 11:21 AM.       ALLERGIES Allergies  Allergen Reactions   Doxycycline Hives   Penicillins Hives   Quetiapine     REACTION: involuntary licking of tongue against teeth, felt really bad on med   Sulfonamide Derivatives Nausea And Vomiting    PAST MEDICAL HISTORY Past Medical History:  Diagnosis Date   Bipolar 1 disorder ([place])    COPD (chronic obstructive pulmonary disease) ([place])    CT chest 11/2005 Dr. Koleen Nimrod   Coronary artery disease    minimal bilateral disease   Hypertension    Pulmonary hypertension ([place])    Past Surgical History:  Procedure Laterality Date   CATARACT EXTRACTION Right    CATARACT EXTRACTION W/PHACO Left 12/01/2017   Procedure: CATARACT EXTRACTION PHACO AND INTRAOCULAR LENS PLACEMENT (IOC);  Surgeon: Baruch Goldmann, MD;  Location: [facility];  Service: Ophthalmology;  Laterality: Left;  CDE: 11.86   DILATION AND CURETTAGE OF UTERUS     hemrrhoidectomy      FAMILY HISTORY Family History  Problem Relation Age of Onset   Cancer Other    Coronary artery disease Other     SOCIAL HISTORY Social History   Tobacco Use   Smoking status: Never   Smokeless tobacco: Never  Vaping Use   Vaping Use: Never used   Substance Use Topics   Alcohol use: No   Drug use: Never         OPHTHALMIC EXAM:  Base Eye Exam     Visual Acuity (ETDRS)       Right Left   Dist cc HM 20/50   Dist ph cc NI NI    Correction: Glasses         Tonometry (Tonopen, 10:11 AM)       Right Left   Pressure 20 13         Pupils       Pupils APD   Right PERRL None   Left PERRL None         Extraocular Movement       Right Left    Full Full         Neuro/Psych     Oriented x3: Yes   Mood/Affect: Normal         Dilation     Both eyes: 1.0% Mydriacyl, 2.5% Phenylephrine @ 10:11 AM           Slit Lamp and Fundus Exam     External Exam       Right Left   External Normal Normal         Slit Lamp Exam       Right Left   Lids/Lashes Ectropion lower lid Ectropion lower lid   Conjunctiva/Sclera White and quiet White and quiet   Cornea Clear Clear   Anterior Chamber Deep and quiet Deep and quiet   Iris Round and reactive Round and reactive   Lens Centered posterior chamber intraocular lens Centered posterior chamber intraocular lens   Anterior Vitreous Normal Normal         Fundus Exam       Right Left   Posterior Vitreous Normal Normal   Disc Pallor Pallor 1+   C/D Ratio 0.4 0.4   Macula less Macular thickening, Drusen, Intermediate age related macular degeneration, Disciform scar, , Geographic atrophy subfoveal accounts for acuity of nearly 1 disc area in size Early age related macular degeneration, , Disciform scar, pigmentary atrophy   Vessels Normal Normal   Periphery Normal Normal            IMAGING AND PROCEDURES  Imaging and Procedures for 12/29/21  OCT, Retina - OU - Both Eyes       Right Eye Quality was borderline. Scan locations included temporal. Central Foveal Thickness: 229. Progression has been stable. Findings include abnormal foveal contour, central retinal atrophy, inner retinal atrophy, outer retinal atrophy.   Left Eye Quality was  borderline. Scan locations included subfoveal. Central Foveal Thickness: 319. Progression has been stable. Findings include abnormal foveal contour, subretinal fluid.   Notes OS stable at 10-week interval post injection with preserved acuity in fact improved.  Less subretinal fluid still active, we will maintain condition by repeating Avastin OS today to maintaining 10-week interval to maintain lower or decrease treatment burden     Intravitreal Injection, Pharmacologic Agent - OS - Left Eye       Time Out 12/29/2021. 11:23 AM. Confirmed correct patient, procedure, site, and patient consented.   Anesthesia Topical anesthesia was used. Anesthetic medications included Lidocaine 4%.   Procedure Preparation included 5% betadine to ocular surface, 10% betadine to eyelids, Tobramycin 0.3%. A 30 gauge needle was used.   Injection: 2.5 mg bevacizumab 2.5 MG/0.1ML   Route: Intravitreal, Site: Left Eye   [facility]: [phone], Lot: [id number], Expiration date: 02/13/2022   Post-op Post injection exam found visual acuity of at least counting fingers. The patient tolerated the procedure well. There were no complications. The patient received written and verbal post procedure care education. Post injection medications included ocuflox.              ASSESSMENT/PLAN:  Exudative age-related macular degeneration of left eye with active choroidal neovascularization (HCC) OS, improved overall and stable.  Chronic subretinal fluid persists even at 10 weeks post Avastin but with good acuity will continue to treat at longer intervals of therapy to diminish treatment burden upon this ambulatory challenged patient  Exudative age-related macular degeneration of right eye with inactive choroidal neovascularization (HCC) No active disease in active disciform  Serous retinal detachment, unspecified eye Component of wet AMD OS     ICD-10-CM   1. Exudative age-related macular degeneration of left eye with  active choroidal neovascularization (HCC)  H35.3221 OCT, Retina - OU - Both Eyes    Intravitreal Injection, Pharmacologic Agent - OS - Left Eye    bevacizumab (AVASTIN) SOSY 2.5 mg    2. Exudative age-related macular degeneration of right eye with inactive choroidal neovascularization ([place])  H35.3212     3. Serous retinal detachment of left eye  H33.22       1.  OS with retained excellent acuity.  Still with chronic subretinal fluid but slightly less again at 10-week postinjection Avastin for CNVM associated with serous retinal detachment.  We will continue to treat today with Avastin follow-up next in 10 weeks  2.  3.  Ophthalmic Meds Ordered this visit:  Meds ordered this encounter  Medications   bevacizumab (AVASTIN) SOSY 2.5 mg       Return in about 10 weeks (around 03/09/2022) for DILATE OU, AVASTIN OCT, OS.  There are no Patient Instructions on file for this visit.   Explained the diagnoses, plan, and follow up with the patient and they expressed understanding.  Patient expressed understanding of the importance of proper follow up care.   Clent Demark Treon Kehl M.D. Diseases & Surgery of the Retina and Vitreous [facility] 12/29/21     Abbreviations: M myopia (nearsighted); A astigmatism; H hyperopia (farsighted); P presbyopia; Mrx spectacle prescription;  CTL contact lenses; OD right eye; OS left eye; OU both eyes  XT exotropia; ET esotropia; PEK punctate epithelial keratitis; PEE punctate epithelial erosions; DES dry eye syndrome; MGD meibomian gland dysfunction; ATs artificial tears; PFAT's preservative free artificial tears; [place] nuclear sclerotic cataract; PSC posterior subcapsular cataract; ERM epi-retinal membrane; PVD posterior vitreous detachment; RD retinal detachment; DM diabetes mellitus; DR diabetic retinopathy; NPDR non-proliferative diabetic retinopathy; PDR proliferative diabetic retinopathy; CSME clinically significant macular edema; DME diabetic  macular edema; dbh dot blot hemorrhages; CWS cotton wool spot; POAG primary open angle glaucoma; C/D cup-to-disc ratio; HVF humphrey  visual field; GVF goldmann visual field; OCT optical coherence tomography; IOP intraocular pressure; BRVO Branch retinal vein occlusion; CRVO central retinal vein occlusion; CRAO central retinal artery occlusion; BRAO branch retinal artery occlusion; RT retinal tear; SB scleral buckle; PPV pars plana vitrectomy; VH Vitreous hemorrhage; PRP panretinal laser photocoagulation; IVK intravitreal kenalog; VMT vitreomacular traction; MH Macular hole;  NVD neovascularization of the disc; NVE neovascularization elsewhere; AREDS age related eye disease study; ARMD age related macular degeneration; POAG primary open angle glaucoma; EBMD epithelial/anterior basement membrane dystrophy; ACIOL anterior chamber intraocular lens; IOL intraocular lens; PCIOL posterior chamber intraocular lens; Phaco/IOL phacoemulsification with intraocular lens placement; [place] photorefractive keratectomy; LASIK laser assisted in situ keratomileusis; HTN hypertension; DM diabetes mellitus; COPD chronic obstructive pulmonary disease

## 2022-03-09 ENCOUNTER — Encounter (INDEPENDENT_AMBULATORY_CARE_PROVIDER_SITE_OTHER): Payer: Medicare Other | Admitting: Ophthalmology

## 2022-03-16 ENCOUNTER — Encounter (INDEPENDENT_AMBULATORY_CARE_PROVIDER_SITE_OTHER): Payer: Medicare Other | Admitting: Ophthalmology

## 2022-03-16 ENCOUNTER — Encounter (INDEPENDENT_AMBULATORY_CARE_PROVIDER_SITE_OTHER): Payer: Self-pay | Admitting: Ophthalmology

## 2022-03-16 ENCOUNTER — Ambulatory Visit (INDEPENDENT_AMBULATORY_CARE_PROVIDER_SITE_OTHER): Payer: Medicare Other | Admitting: Ophthalmology

## 2022-03-16 DIAGNOSIS — H353221 Exudative age-related macular degeneration, left eye, with active choroidal neovascularization: Secondary | ICD-10-CM

## 2022-03-16 DIAGNOSIS — H3322 Serous retinal detachment, left eye: Secondary | ICD-10-CM

## 2022-03-16 DIAGNOSIS — H353212 Exudative age-related macular degeneration, right eye, with inactive choroidal neovascularization: Secondary | ICD-10-CM | POA: Diagnosis not present

## 2022-03-16 DIAGNOSIS — H353114 Nonexudative age-related macular degeneration, right eye, advanced atrophic with subfoveal involvement: Secondary | ICD-10-CM

## 2022-03-16 NOTE — Progress Notes (Signed)
03/16/2022     CHIEF COMPLAINT Patient presents for  Chief Complaint  Patient presents with   Macular Degeneration      HISTORY OF PRESENT ILLNESS: Isabella Campbell is a 88 y.o. female who presents to the clinic today for:   HPI   History of center involved knee wet AMD OU.  OD been quiescent.  Disciform scar remaining OD.  OS with chronic serous retinal detachment stable acuity with maintenance evaluation every 3 months.  Repeat evaluation today and likely Avastin OS  11 weeks dilate ou avstin oct os Pt states her vision has not been stable Pt denies any new floaters or FOL Pt states her right eye always has matter in it Last edited by Edmon Crape, MD on 03/16/2022 11:34 AM.      Referring physician: Charlynne Pander, MD [address] [place],  [place] [address]  HISTORICAL INFORMATION:   Selected notes from the MEDICAL RECORD NUMBER       CURRENT MEDICATIONS: No current outpatient medications on file. (Ophthalmic Drugs)   No current facility-administered medications for this visit. (Ophthalmic Drugs)   Current Outpatient Medications (Other)  Medication Sig   acetaminophen (TYLENOL) 325 MG tablet Take 650 mg by mouth every 6 (six) hours as needed.   Ascorbic Acid (VITAMIN C) 1000 MG tablet Take 1,000 mg by mouth daily.   atenolol (TENORMIN) 25 MG tablet Take 25 mg by mouth daily.    benztropine (COGENTIN) 0.5 MG tablet Take 0.5 mg by mouth daily.    Cholecalciferol 1000 units tablet Take 1,000 Units by mouth.   Coenzyme Q10 100 MG capsule Take 100 mg by mouth 3 (three) times daily.   divalproex (DEPAKOTE) 250 MG DR tablet Take 250 mg by mouth 2 (two) times daily.   donepezil (ARICEPT) 5 MG tablet Take 5 mg by mouth at bedtime.   furosemide (LASIX) 20 MG tablet Take 1 tablet by mouth daily as needed for fluid.    gabapentin (NEURONTIN) 100 MG capsule Take 100 mg by mouth at bedtime.   Ipratropium-Albuterol (COMBIVENT) 20-100 MCG/ACT AERS respimat Inhale 1 puff into  the lungs 4 (four) times daily.   Multiple Vitamin (MULTIVITAMIN) tablet Take 1 tablet by mouth daily.   Multiple Vitamins-Minerals (EYE VITAMINS & MINERALS) TABS Take 1 tablet by mouth daily.   potassium chloride (K-DUR,KLOR-CON) 10 MEQ tablet Take 1 tablet by mouth daily as needed (for fluid retention when taking Lasix (Furosemide)).    risperiDONE (RISPERDAL) 1 MG tablet Take 1 mg by mouth at bedtime.    traZODone (DESYREL) 50 MG tablet Take 50 mg by mouth at bedtime.   Turmeric 500 MG CAPS Take 1 capsule by mouth daily.    No current facility-administered medications for this visit. (Other)      REVIEW OF SYSTEMS: ROS   Negative for: Constitutional, Gastrointestinal, Neurological, Skin, Genitourinary, Musculoskeletal, HENT, Endocrine, Cardiovascular, Eyes, Respiratory, Psychiatric, Allergic/Imm, Heme/Lymph Last edited by Aleene Davidson, CMA on 03/16/2022 10:45 AM.       ALLERGIES Allergies  Allergen Reactions   Doxycycline Hives   Penicillins Hives   Quetiapine     REACTION: involuntary licking of tongue against teeth, felt really bad on med   Sulfonamide Derivatives Nausea And Vomiting    PAST MEDICAL HISTORY Past Medical History:  Diagnosis Date   Bipolar 1 disorder (HCC)    COPD (chronic obstructive pulmonary disease) (HCC)    CT chest 11/2005 Dr. Orson Aloe   Coronary artery disease  minimal bilateral disease   Hypertension    Pulmonary hypertension (HCC)    Past Surgical History:  Procedure Laterality Date   CATARACT EXTRACTION Right    CATARACT EXTRACTION W/PHACO Left 12/01/2017   Procedure: CATARACT EXTRACTION PHACO AND INTRAOCULAR LENS PLACEMENT (IOC);  Surgeon: Fabio Pierce, MD;  Location: [facility];  Service: Ophthalmology;  Laterality: Left;  CDE: 11.86   DILATION AND CURETTAGE OF UTERUS     hemrrhoidectomy      FAMILY HISTORY Family History  Problem Relation Age of Onset   Cancer Other    Coronary artery disease Other     SOCIAL  HISTORY Social History   Tobacco Use   Smoking status: Never   Smokeless tobacco: Never  Vaping Use   Vaping Use: Never used  Substance Use Topics   Alcohol use: No   Drug use: Never         OPHTHALMIC EXAM:  Base Eye Exam     Visual Acuity (ETDRS)       Right Left   Dist cc HM 20/80    Correction: Glasses         Tonometry (Tonopen, 10:49 AM)       Right Left   Pressure 19 11         Neuro/Psych     Oriented x3: Yes   Mood/Affect: Normal         Dilation     Both eyes: 1.0% Mydriacyl, 2.5% Phenylephrine @ 10:46 AM           Slit Lamp and Fundus Exam     External Exam       Right Left   External Normal Normal         Slit Lamp Exam       Right Left   Lids/Lashes Ectropion lower lid Ectropion lower lid   Conjunctiva/Sclera White and quiet White and quiet   Cornea Clear Clear   Anterior Chamber Deep and quiet Deep and quiet   Iris Round and reactive Round and reactive   Lens Centered posterior chamber intraocular lens Centered posterior chamber intraocular lens   Anterior Vitreous Normal Normal         Fundus Exam       Right Left   Posterior Vitreous Normal Normal   Disc Pallor Pallor 1+   C/D Ratio 0.4 0.4   Macula Drusen, Disciform scar, , Geographic atrophy subfoveal accounts for acuity of nearly 1 disc area in size, Advanced age related macular degeneration Early age related macular degeneration, , Disciform scar, pigmentary atrophy   Vessels Normal Normal   Periphery Normal Normal            IMAGING AND PROCEDURES  Imaging and Procedures for 03/17/22  OCT, Retina - OU - Both Eyes       Right Eye Quality was borderline. Scan locations included temporal.   Left Eye Quality was borderline. Scan locations included subfoveal. Central Foveal Thickness: 321. Progression has been stable. Findings include abnormal foveal contour, subretinal fluid.   Notes OS stable at 11-week interval post injection with preserved  acuity in fact improved.  Less subretinal fluid still active, we will maintain condition by repeating Avastin OS today to maintaining 10-week interval to maintain lower or decrease treatment burden     Intravitreal Injection, Pharmacologic Agent - OS - Left Eye       Time Out 03/16/2022. 11:11 AM. Confirmed correct patient, procedure, site, and patient consented.   Anesthesia Topical anesthesia was  used. Anesthetic medications included Lidocaine 4%.   Procedure Preparation included 5% betadine to ocular surface, 10% betadine to eyelids. A 30 gauge needle was used.   Injection: 1.25 mg Bevacizumab 1.25mg /0.66ml   Route: Intravitreal, Site: Left Eye   NDC: [id number], Lot: [id number], Expiration date: 06/01/2022   Post-op Post injection exam found visual acuity of at least counting fingers. The patient tolerated the procedure well. There were no complications. The patient received written and verbal post procedure care education. Post injection medications included ocuflox.              ASSESSMENT/PLAN:  Exudative age-related macular degeneration of left eye with active choroidal neovascularization (HCC) Persistent subretinal fluid at nearly 46-month interval.  Repeat injection today to maintain.  Exudative age-related macular degeneration of right eye with inactive choroidal neovascularization (HCC) Clinical exam alone no sign of CNVM recurrence  Advanced nonexudative age-related macular degeneration of right eye with subfoveal involvement Accounts for acuity  Serous retinal detachment, unspecified eye Component wet AMD continues treatment with antivegF Avastin today     ICD-10-CM   1. Exudative age-related macular degeneration of left eye with active choroidal neovascularization (HCC)  H35.3221 OCT, Retina - OU - Both Eyes    Intravitreal Injection, Pharmacologic Agent - OS - Left Eye    Bevacizumab (AVASTIN) SOLN 1.25 mg    2. Exudative age-related macular degeneration  of right eye with inactive choroidal neovascularization (HCC)  H35.3212     3. Advanced nonexudative age-related macular degeneration of right eye with subfoveal involvement  H35.3114     4. Serous retinal detachment of left eye  H33.22       1.  2.  3.  Ophthalmic Meds Ordered this visit:  Meds ordered this encounter  Medications   Bevacizumab (AVASTIN) SOLN 1.25 mg       Return in about 3 months (around 06/16/2022) for DILATE OU, AVASTIN OCT, OS.  There are no Patient Instructions on file for this visit.   Explained the diagnoses, plan, and follow up with the patient and they expressed understanding.  Patient expressed understanding of the importance of proper follow up care.   Alford Highland Cloa Bushong M.D. Diseases & Surgery of the Retina and Vitreous [facility] 03/17/22     Abbreviations: M myopia (nearsighted); A astigmatism; H hyperopia (farsighted); P presbyopia; Mrx spectacle prescription;  CTL contact lenses; OD right eye; OS left eye; OU both eyes  XT exotropia; ET esotropia; PEK punctate epithelial keratitis; PEE punctate epithelial erosions; DES dry eye syndrome; MGD meibomian gland dysfunction; ATs artificial tears; PFAT's preservative free artificial tears; NSC nuclear sclerotic cataract; PSC posterior subcapsular cataract; ERM epi-retinal membrane; PVD posterior vitreous detachment; RD retinal detachment; DM diabetes mellitus; DR diabetic retinopathy; NPDR non-proliferative diabetic retinopathy; PDR proliferative diabetic retinopathy; CSME clinically significant macular edema; DME diabetic macular edema; dbh dot blot hemorrhages; CWS cotton wool spot; POAG primary open angle glaucoma; C/D cup-to-disc ratio; HVF humphrey visual field; GVF goldmann visual field; OCT optical coherence tomography; IOP intraocular pressure; BRVO Branch retinal vein occlusion; CRVO central retinal vein occlusion; CRAO central retinal artery occlusion; BRAO branch retinal artery  occlusion; RT retinal tear; SB scleral buckle; PPV pars plana vitrectomy; VH Vitreous hemorrhage; PRP panretinal laser photocoagulation; IVK intravitreal kenalog; VMT vitreomacular traction; MH Macular hole;  NVD neovascularization of the disc; NVE neovascularization elsewhere; AREDS age related eye disease study; ARMD age related macular degeneration; POAG primary open angle glaucoma; EBMD epithelial/anterior basement membrane dystrophy; ACIOL anterior chamber intraocular  lens; IOL intraocular lens; PCIOL posterior chamber intraocular lens; Phaco/IOL phacoemulsification with intraocular lens placement; Henefer photorefractive keratectomy; LASIK laser assisted in situ keratomileusis; HTN hypertension; DM diabetes mellitus; COPD chronic obstructive pulmonary disease

## 2022-03-16 NOTE — Assessment & Plan Note (Signed)
Accounts for acuity 

## 2022-03-16 NOTE — Assessment & Plan Note (Signed)
Component wet AMD continues treatment with antivegF Avastin today

## 2022-03-16 NOTE — Assessment & Plan Note (Signed)
Persistent subretinal fluid at nearly 59-month interval.  Repeat injection today to maintain.

## 2022-03-16 NOTE — Assessment & Plan Note (Signed)
Clinical exam alone no sign of CNVM recurrence

## 2022-03-17 DIAGNOSIS — H353221 Exudative age-related macular degeneration, left eye, with active choroidal neovascularization: Secondary | ICD-10-CM | POA: Diagnosis not present

## 2022-03-17 MED ORDER — BEVACIZUMAB CHEMO INJECTION 1.25MG/0.05ML SYRINGE FOR KALEIDOSCOPE
1.2500 mg | INTRAVITREAL | Status: AC | PRN
  Administered 2022-03-17: 1.25 mg via INTRAVITREAL

## 2022-03-17 NOTE — Addendum Note (Signed)
Addended by: Fawn Kirk A on: 03/17/2022 12:12 PM   Modules accepted: Orders

## 2022-06-16 ENCOUNTER — Encounter (INDEPENDENT_AMBULATORY_CARE_PROVIDER_SITE_OTHER): Payer: Medicare Other | Admitting: Ophthalmology

## 2023-08-09 ENCOUNTER — Encounter: Payer: Self-pay | Admitting: Gastroenterology

## 2023-08-09 ENCOUNTER — Ambulatory Visit: Payer: Medicare Other | Admitting: Gastroenterology

## 2023-08-09 VITALS — BP 149/89 | HR 94 | Temp 98.6°F | Ht 65.0 in | Wt 132.4 lb

## 2023-08-09 DIAGNOSIS — Z8719 Personal history of other diseases of the digestive system: Secondary | ICD-10-CM

## 2023-08-09 DIAGNOSIS — K5900 Constipation, unspecified: Secondary | ICD-10-CM | POA: Diagnosis not present

## 2023-08-09 NOTE — Patient Instructions (Signed)
Please fax a copy of her last 2 abdominal x-rays to [phone]. Please fax a copy of her bowel movement records from the last 1 month to [phone].

## 2023-08-09 NOTE — Progress Notes (Signed)
GI Office Note    Referring Provider: Charlynne Pander, MD Primary Care Physician:  Charlynne Pander, MD  Primary Gastroenterologist: Hennie Duos. Marletta Lor, DO   Chief Complaint   Chief Complaint  Patient presents with   Constipation     History of Present Illness   Isabella Campbell is a 89 y.o. female presenting today at the request of Dr. Charlynne Pander for paralytic ileus of small intestine and colon.  Patient presents with transporter from [facility] facility. Patient is hard of hearing, but able to answer some simple questions. Over past couple of months, per staff, she has had increasing difficulty with her bowel movements. Reports abdominal xrays show ileus. Non available at this time. She is not sure how often she is having a BM. "They have tried about everything but a soap suds enema". She is currently on bisacodyl 5mg  BID and miralax 17g BID. She has a good appetite. No reports of N/V. No blood in the stool. Patient reports some abdominal pain at times but unable to elaborate on it. She is an avid reader. Excited to share her book with me today.  Daughter: Isabella Campbell.  Home phone number [phone].  EGD 2006:  FINAL DIAGNOSES:  1.  Mild changes of reflux esophagitis limited the gastroesophageal      junction.  No evidence of Barrett's or hernia.  2.  Erosive antral gastritis.  3.  Fundal polyp.  It was ablated via cold biopsy.  Medications   Current Outpatient Medications  Medication Sig Dispense Refill   acetaminophen (TYLENOL) 325 MG tablet Take 650 mg by mouth every 6 (six) hours as needed.     Ascorbic Acid (VITAMIN C) 1000 MG tablet Take 1,000 mg by mouth daily.     bisacodyl 5 MG EC tablet Take 5 mg by mouth 2 (two) times daily.     busPIRone (BUSPAR) 10 MG tablet Take 10 mg by mouth 2 (two) times daily.     Cholecalciferol 1000 units tablet Take 1,000 Units by mouth daily.     dextromethorphan-guaiFENesin (ROBITUSSIN-DM) 10-100 MG/5ML liquid Take 10 mLs by mouth  every 6 (six) hours as needed for cough.     divalproex (DEPAKOTE SPRINKLE) 125 MG capsule Take by mouth.     furosemide (LASIX) 20 MG tablet Take 1 tablet by mouth daily as needed for fluid.   3   gabapentin (NEURONTIN) 100 MG capsule Take 100 mg by mouth at bedtime.     Multiple Vitamins-Minerals (EYE VITAMINS & MINERALS) TABS Take 1 tablet by mouth daily.     omeprazole (PRILOSEC) 20 MG capsule Take 20 mg by mouth daily.     Polyethyl Glycol-Propyl Glycol 0.4-0.3 % SOLN Apply 1 drop to eye 4 (four) times daily.     Polyethylene Glycol 3350 POWD Take 17 g by mouth 2 (two) times daily.     risperiDONE (RISPERDAL) 1 MG tablet Take 1 mg by mouth 2 (two) times daily.     traZODone (DESYREL) 100 MG tablet Take 100 mg by mouth at bedtime.     No current facility-administered medications for this visit.    Allergies   Allergies as of 08/09/2023 - Review Complete 08/09/2023  Allergen Reaction Noted   Doxycycline Hives    Penicillins Hives    Quetiapine     Sulfonamide derivatives Nausea And Vomiting     Past Medical History   Past Medical History:  Diagnosis Date   Bipolar 1 disorder (HCC)  COPD (chronic obstructive pulmonary disease) (HCC)    CT chest 11/2005 Dr. Orson Aloe   Coronary artery disease    minimal bilateral disease   Hypertension    Pulmonary hypertension [facility])     Past Surgical History   Past Surgical History:  Procedure Laterality Date   CATARACT EXTRACTION Right    CATARACT EXTRACTION W/PHACO Left 12/01/2017   Procedure: CATARACT EXTRACTION PHACO AND INTRAOCULAR LENS PLACEMENT (IOC);  Surgeon: Fabio Pierce, MD;  Location: [facility];  Service: Ophthalmology;  Laterality: Left;  CDE: 11.86   DILATION AND CURETTAGE OF UTERUS     hemrrhoidectomy      Past Family History   Family History  Problem Relation Age of Onset   Cancer Other    Coronary artery disease Other     Past Social History   Social History   Socioeconomic History   Marital status:  Widowed    Spouse name: Not on file   Number of children: Not on file   Years of education: Not on file   Highest education level: Not on file  Occupational History   Not on file  Tobacco Use   Smoking status: Never   Smokeless tobacco: Never  Vaping Use   Vaping status: Never Used  Substance and Sexual Activity   Alcohol use: No   Drug use: Never   Sexual activity: Not Currently  Other Topics Concern   Not on file  Social History Narrative   Not on file   Social Drivers of Health   Financial Resource Strain: Not on file  Food Insecurity: Not on file  Transportation Needs: Not on file  Physical Activity: Not on file  Stress: Not on file  Social Connections: Not on file  Intimate Partner Violence: Not on file    Review of Systems  ?reliability  General: Negative for anorexia, weight loss, fever, chills, fatigue, weakness. Eyes: Negative for vision changes.  ENT: Negative for hoarseness, difficulty swallowing , nasal congestion. Nose runs. CV: Negative for chest pain, angina, palpitations, dyspnea on exertion, peripheral edema.  Respiratory: Negative for dyspnea at rest, dyspnea on exertion, cough, sputum, wheezing.  GI: See history of present illness. GU:  Negative for dysuria, hematuria, urinary incontinence, urinary frequency, nocturnal urination.  MS: Negative for joint pain, low back pain.  Derm: Negative for rash or itching.  Neuro: Negative for weakness, abnormal sensation, seizure, frequent headaches, memory loss,  confusion.  Psych: Negative for anxiety, depression, suicidal ideation, hallucinations.  Endo: Negative for unusual weight change.  Heme: Negative for bruising or bleeding. Allergy: Negative for rash or hives.  Physical Exam   BP (!) 149/89 (BP Location: Right Arm, Patient Position: Sitting, Cuff Size: Normal)   Pulse 94   Temp 98.6 F (37 C) (Temporal)   Ht 5\' 5"  (1.651 m)   Wt 132 lb 6.4 oz (60.1 kg) Comment: facility reported 07/21/23  BMI  22.03 kg/m    General: frail elderly female in wheelchair. Pleasant, cooperative, hard of hearing. in no acute distress.  Head: Normocephalic, atraumatic.   Eyes: Conjunctiva pink, no icterus. Mouth: Oropharyngeal mucosa moist and pink   Neck: Supple without thyromegaly, masses, or lymphadenopathy.  Lungs: Clear to auscultation bilaterally.  Heart: Regular rate and rhythm, no murmurs rubs or gallops.  Abdomen: Bowel sounds are normal, nontender, nondistended, no hepatosplenomegaly or masses,  no abdominal bruits or hernia, no rebound or guarding.   Rectal: not performed  Extremities: No lower extremity edema. No clubbing or deformities.  Neuro: Alert  and oriented x 4 , grossly normal neurologically.  Skin: Warm and dry, no rash or jaundice.   Psych: Alert and cooperative, normal mood and affect.  Labs   None available  Imaging Studies   No results found.  Assessment/Plan:   Reported ileus: patient unable to provide adequate history -request copy of last two abdominal films -request copy of BM records -continue bisacody and miralax for now. May be a candidate for Motegrity but need to gather more information prior to prescribing.    Leanna Battles. Melvyn Neth, MHS, PA-C [facility]

## 2023-08-22 ENCOUNTER — Telehealth: Payer: Self-pay | Admitting: Gastroenterology

## 2023-08-22 NOTE — Telephone Encounter (Signed)
Can we request records again from nursing home? Need last two abd xrays results, copy of bowel movement records last one month.

## 2023-08-24 ENCOUNTER — Telehealth: Payer: Self-pay | Admitting: Gastroenterology

## 2023-08-24 NOTE — Telephone Encounter (Signed)
 Reviewed records from nursing home:  Two abdominal xrays in 06/2023 with concern for ileus BM records since 07/2023, having at least one stool daily, sometimes 2-3.   I would recommend continuing current regimen for constipation, bisacodyl and miralax.   Ov prn.

## 2023-08-28 NOTE — Telephone Encounter (Signed)
 Nurse Orion Birks at nursing home was made aware and verbalized understanding.

## 2023-10-17 DEATH — deceased
